# Patient Record
Sex: Female | Born: 2000 | Race: White | Hispanic: No | Marital: Single | Smoking: Never smoker
Health system: Southern US, Community
[De-identification: ages and names within clinical notes are randomized; demographics above are authoritative.]

## PROBLEM LIST (undated history)

## (undated) DIAGNOSIS — F32A Depression, unspecified: Secondary | ICD-10-CM

## (undated) DIAGNOSIS — F329 Major depressive disorder, single episode, unspecified: Secondary | ICD-10-CM

## (undated) DIAGNOSIS — F419 Anxiety disorder, unspecified: Secondary | ICD-10-CM

## (undated) HISTORY — PX: NO PAST SURGERIES: SHX2092

---
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
## 2019-10-29 ENCOUNTER — Emergency Department: Payer: BLUE CROSS/BLUE SHIELD

## 2019-10-29 ENCOUNTER — Other Ambulatory Visit: Payer: Self-pay

## 2019-10-29 ENCOUNTER — Emergency Department
Admission: EM | Admit: 2019-10-29 | Discharge: 2019-10-29 | Disposition: A | Discharge status: Home / Self Care | Payer: BLUE CROSS/BLUE SHIELD | Attending: Emergency Medicine | Admitting: Emergency Medicine

## 2019-10-29 DIAGNOSIS — S22030A Wedge compression fracture of third thoracic vertebra, initial encounter for closed fracture: Secondary | ICD-10-CM | POA: Insufficient documentation

## 2019-10-29 DIAGNOSIS — S22000A Wedge compression fracture of unspecified thoracic vertebra, initial encounter for closed fracture: Secondary | ICD-10-CM

## 2019-10-29 DIAGNOSIS — Y999 Unspecified external cause status: Secondary | ICD-10-CM | POA: Insufficient documentation

## 2019-10-29 DIAGNOSIS — S22009A Unspecified fracture of unspecified thoracic vertebra, initial encounter for closed fracture: Secondary | ICD-10-CM

## 2019-10-29 DIAGNOSIS — Y92049 Unspecified place in boarding-house as the place of occurrence of the external cause: Secondary | ICD-10-CM | POA: Insufficient documentation

## 2019-10-29 DIAGNOSIS — S22088A Other fracture of T11-T12 vertebra, initial encounter for closed fracture: Secondary | ICD-10-CM | POA: Diagnosis not present

## 2019-10-29 DIAGNOSIS — S22050A Wedge compression fracture of T5-T6 vertebra, initial encounter for closed fracture: Secondary | ICD-10-CM | POA: Diagnosis not present

## 2019-10-29 DIAGNOSIS — Y9389 Activity, other specified: Secondary | ICD-10-CM | POA: Diagnosis not present

## 2019-10-29 DIAGNOSIS — M546 Pain in thoracic spine: Secondary | ICD-10-CM

## 2019-10-29 DIAGNOSIS — W108XXA Fall (on) (from) other stairs and steps, initial encounter: Secondary | ICD-10-CM | POA: Insufficient documentation

## 2019-10-29 DIAGNOSIS — S299XXA Unspecified injury of thorax, initial encounter: Secondary | ICD-10-CM | POA: Diagnosis present

## 2019-10-29 DIAGNOSIS — W19XXXA Unspecified fall, initial encounter: Secondary | ICD-10-CM

## 2019-10-29 LAB — POCT PREGNANCY, URINE: Preg Test, Ur: NEGATIVE

## 2019-10-29 MED ORDER — IBUPROFEN 800 MG PO TABS
800.0000 mg | ORAL_TABLET | Freq: Once | ORAL | Status: AC
  Administered 2019-10-29: 06:00:00 800 mg via ORAL
  Filled 2019-10-29: qty 1

## 2019-10-29 MED ORDER — CYCLOBENZAPRINE HCL 5 MG PO TABS: 5.0000 mg | ORAL_TABLET | Freq: Three times a day (TID) | ORAL | 0 refills | Status: AC | PRN

## 2019-10-29 MED ORDER — ACETAMINOPHEN 500 MG PO TABS
1000.0000 mg | ORAL_TABLET | Freq: Once | ORAL | Status: AC
  Administered 2019-10-29: 1000 mg via ORAL
  Filled 2019-10-29: qty 2

## 2019-10-29 NOTE — ED Provider Notes (Signed)
-----------------------------------------   7:11 AM on 10/29/2019 -----------------------------------------  Blood pressure 129/86, pulse 64, temperature 98.3 F (36.8 C), temperature source Oral, resp. rate 16, height 5\' 5"  (1.651 m), weight 59 kg, last menstrual period 10/01/2019, SpO2 100 %.  Assuming care from Dr. 10/03/2019.  In short, Nicole Jackson is a 19 y.o. female with a chief complaint of Fall and Back Pain .  Refer to the original H&P for additional details.  The current plan of care is to follow-up CT results for possible compression fracture.  ----------------------------------------- 8:40 AM on 10/29/2019 -----------------------------------------  CT scan of thoracic spine is significant for T12 transverse process fracture that is nondisplaced, as well as age-indeterminate small compression fractures at T3 and T5.  On reevaluation, patient is neurovascularly intact with minimal pain.  Case was discussed with Dr. 10/31/2019 of neurosurgery, who will be able to see patient in follow-up, but admission not required as patient's pain is under control.  These areas would be difficult for brace placement.  Patient counseled to use Tylenol or ibuprofen, will also prescribe short course of muscle relaxants.  She was counseled to return to the ED for new or worsening symptoms, otherwise schedule follow-up with neurosurgery.  Patient agrees with plan.    Marcell Barlow, MD 10/29/19 [phone]

## 2019-10-29 NOTE — ED Notes (Signed)
Pt transported to xray 

## 2019-10-29 NOTE — ED Notes (Signed)
Pt speaking with this RN in NAD, A&Ox4. Offered pt a blanket and something to drink but reports no needs at this time. Lights turned off for comfort per pt request

## 2019-10-29 NOTE — ED Provider Notes (Signed)
[facility] Emergency Department Provider Note   ____________________________________________   First MD Initiated Contact with Patient 10/29/19 [phone]     (approximate)  I have reviewed the triage vital signs and the nursing notes.   HISTORY  Chief Complaint Fall and Back Pain    HPI Nicole Jackson is a 19 y.o. female who presents to the ED from [facility] with a chief complaint of back pain status post mechanical fall.  Patient reports she fell down 5 steps at a fr[employer] house, landing on her back.  Complains of thoracic back pain.  Took some ibuprofen around midnight which helped her symptoms but pain returns.  Denies striking head or LOC.  Denies neck pain, chest pain, abdominal pain, nausea, vomiting or dizziness.  Initially had some shortness of breath but none currently.  Denies lower back or tailbone pain as noted in the triage note.       Past medical history None  There are no problems to display for this patient.   History reviewed. No pertinent surgical history.  Prior to Admission medications   Not on File    Allergies Patient has no known allergies.  No family history on file.  Social History Social History   Tobacco Use  . Smoking status: Never Smoker  . Smokeless tobacco: Former Engineer, water Use Topics  . Alcohol use: Not on file  . Drug use: Not on file    Review of Systems  Constitutional: No fever/chills Eyes: No visual changes. ENT: No sore throat. Cardiovascular: Denies chest pain. Respiratory: Denies shortness of breath. Gastrointestinal: No abdominal pain.  No nausea, no vomiting.  No diarrhea.  No constipation. Genitourinary: Negative for dysuria. Musculoskeletal: Positive for back pain. Skin: Negative for rash. Neurological: Negative for headaches, focal weakness or numbness.   ____________________________________________   PHYSICAL EXAM:  VITAL SIGNS: ED Triage Vitals  Enc Vitals Group     BP  10/29/19 0332 (!) 142/96     Pulse Rate 10/29/19 0332 74     Resp 10/29/19 0332 16     Temp 10/29/19 0332 98.3 F (36.8 C)     Temp Source 10/29/19 0332 Oral     SpO2 --      Weight 10/29/19 0324 130 lb (59 kg)     Height 10/29/19 0324 5\' 5"  (1.651 m)     Head Circumference --      Peak Flow --      Pain Score 10/29/19 0324 6     Pain Loc --      Pain Edu? --      Excl. in GC? --     Constitutional: Alert and oriented. Well appearing and in no acute distress. Eyes: Conjunctivae are normal. PERRL. EOMI. Head: Atraumatic. Nose: Atraumatic. Mouth/Throat: Mucous membranes are moist.  No dental malocclusion. Neck: No stridor.  No cervical spine tenderness to palpation. Cardiovascular: Normal rate, regular rhythm. Grossly normal heart sounds.  Good peripheral circulation. Respiratory: Normal respiratory effort.  No retractions. Lungs CTAB.  No rib tenderness to palpation. Gastrointestinal: Soft and nontender to light or deep palpation. No distention. No abdominal bruits. No CVA tenderness. Musculoskeletal: No spinal tenderness to palpation.  Paraspinal thoracic tenderness.  Full range of motion of trunk with mild discomfort.  Pelvis is stable.  No lower extremity tenderness nor edema.  No joint effusions. Neurologic:  Normal speech and language. No gross focal neurologic deficits are appreciated. No gait instability. Skin:  Skin is warm, dry and intact.  No rash noted. Psychiatric: Mood and affect are normal. Speech and behavior are normal.  ____________________________________________   LABS (all labs ordered are listed, but only abnormal results are displayed)  Labs Reviewed  POC URINE PREG, ED  POCT PREGNANCY, URINE   ____________________________________________  EKG  None ____________________________________________  RADIOLOGY  ED MD interpretation: Chest x-ray demonstrates no acute cardiopulmonary process; question Jackson superior endplate deformity at T5  Official  radiology report(s): DG Chest 1 View  Result Date: 10/29/2019 CLINICAL DATA:  Fall, upper back pain EXAM: CHEST  1 VIEW COMPARISON:  Concurrent thoracic spine radiographs. FINDINGS: No consolidation, features of edema, pneumothorax, or effusion. Pulmonary vascularity is normally distributed. The cardiomediastinal contours are unremarkable. No acute osseous or soft tissue abnormality. Thoracic spine better visualized on dedicated radiographs. IMPRESSION: No acute cardiopulmonary or traumatic findings in the chest. Electronically Signed   By: Kreg Shropshire M.D.   On: 10/29/2019 06:29   DG Thoracic Spine 2 View  Result Date: 10/29/2019 CLINICAL DATA:  Fall, upper back pain EXAM: THORACIC SPINE 2 VIEWS COMPARISON:  None. FINDINGS: The T1-T4 levels are poorly visualized due to the overlying shoulder soft tissues. Question a Jackson superior endplate deformity at T5. No other acute osseous abnormality. No traumatic listhesis. Incidental note made of a rudimentary rib on the left at L1. Included portions the chest and mediastinum are unremarkable. IMPRESSION: Question Jackson superior endplate deformity at T5. Correlate for point tenderness. The T1-T4 levels are poorly visualized due to the overlying shoulder soft tissues. Electronically Signed   By: Kreg Shropshire M.D.   On: 10/29/2019 06:29    ____________________________________________   PROCEDURES  Procedure(s) performed (including Critical Care):  Procedures   ____________________________________________   INITIAL IMPRESSION / ASSESSMENT AND PLAN / ED COURSE  As part of my medical decision making, I reviewed the following data within the electronic MEDICAL RECORD NUMBER Nursing notes reviewed and incorporated, Old chart reviewed, Radiograph reviewed, Notes from prior ED visits and [place] Controlled Substance Database     Nicole Jackson was evaluated in Emergency Department on 10/29/2019 for the symptoms described in the history of present illness. She was  evaluated in the context of the global COVID-19 pandemic, which necessitated consideration that the patient might be at risk for infection with the SARS-CoV-2 virus that causes COVID-19. Institutional protocols and algorithms that pertain to the evaluation of patients at risk for COVID-19 are in a state of rapid change based on information released by regulatory bodies including the CDC and federal and state organizations. These policies and algorithms were followed during the patient's care in the ED.    19 year old female who presents with thoracic back pain status post mechanical fall.  Will administer Motrin, obtain x-ray imaging study and reassess.   Clinical Course as of Oct 29 710  Thu Oct 29, 2019  0711 Updated patient on x-ray results.  Will obtain CT scan to further delineate questionable endplate deformity at T5.  Care is transferred to Dr. Larinda Buttery at change of shift pending CT scan and disposition.   [JS]    Clinical Course User Index [JS] Irean Hong, MD     ____________________________________________   FINAL CLINICAL IMPRESSION(S) / ED DIAGNOSES  Final diagnoses:  Fall, initial encounter  Acute bilateral thoracic back pain     ED Discharge Orders    None       Note:  This document was prepared using Dragon voice recognition software and may include unintentional dictation errors.   Irean Hong,  MD 10/29/19 [phone]

## 2019-10-29 NOTE — ED Triage Notes (Signed)
Pt arrives to ED via POV from home with c/o lower back pain s/p fall x2hrs PTA. Pt reports slipping on some steps and landing on her tailbone. Pt denies head injury or LOC. Pt is ambulatory without difficulty, in NAD; RR even, regular, and unlabored.

## 2019-10-29 NOTE — ED Notes (Signed)
Pt transported otf for CT 

## 2019-11-18 ENCOUNTER — Other Ambulatory Visit: Payer: Self-pay

## 2019-11-18 ENCOUNTER — Ambulatory Visit
Admission: EM | Admit: 2019-11-18 | Discharge: 2019-11-18 | Disposition: A | Discharge status: Home / Self Care | Payer: BLUE CROSS/BLUE SHIELD | Attending: Emergency Medicine | Admitting: Emergency Medicine

## 2019-11-18 ENCOUNTER — Encounter: Payer: Self-pay | Admitting: Emergency Medicine

## 2019-11-18 DIAGNOSIS — J020 Streptococcal pharyngitis: Secondary | ICD-10-CM

## 2019-11-18 HISTORY — DX: Depression, unspecified: F32.A

## 2019-11-18 HISTORY — DX: Anxiety disorder, unspecified: F41.9

## 2019-11-18 LAB — POCT RAPID STREP A (OFFICE): Rapid Strep A Screen: POSITIVE — AB

## 2019-11-18 MED ORDER — AMOXICILLIN 875 MG PO TABS: 875.0000 mg | ORAL_TABLET | Freq: Two times a day (BID) | ORAL | 0 refills | Status: AC

## 2019-11-18 NOTE — Discharge Instructions (Addendum)
Take the amoxicillin as directed.  Take Tylenol or ibuprofen as needed for discomfort.    Follow up with your primary care provider if your symptoms are not improving.     

## 2019-11-18 NOTE — ED Triage Notes (Signed)
Patient in today c/o sore throat x 4 days. Patient denies fever, but has had body aches and chills. Patient received 1st covid vaccine yesterday. Patient has tried OTC tea, Ibuprofen and salt water gargles.

## 2019-11-18 NOTE — ED Triage Notes (Signed)
Patient tested negative for covid yesterday.

## 2019-11-18 NOTE — ED Provider Notes (Signed)
[name]    CSN: [id number] Arrival date & time: 11/18/19  0940      History   Chief Complaint Chief Complaint  Patient presents with  . Sore Throat    HPI Nicole Jackson is a 19 y.o. female.   Patient presents with sore throat x4 days.  She also reports body aches x1 day but she received her COVID vaccine yesterday and attributes her aches to this.  She denies fever, chills, difficulty swallowing, cough, shortness of breath, vomiting, diarrhea, rash, or other symptoms.  Treatment attempted at home with ibuprofen, tea, and salt water gargles.     The history is provided by the patient.    Past Medical History:  Diagnosis Date  . Anxiety   . Depression     There are no problems to display for this patient.   Past Surgical History:  Procedure Laterality Date  . NO PAST SURGERIES      OB History   No obstetric history on file.      Home Medications    Prior to Admission medications   Medication Sig Start Date End Date Taking? Authorizing Provider  LESSINA-28 0.1-20 MG-MCG tablet Take 1 tablet by mouth daily. 10/12/19  Yes [provider]  sertraline (ZOLOFT) 100 MG tablet Take 100 mg by mouth daily. 11/14/19  Yes [provider]  amoxicillin (AMOXIL) 875 MG tablet Take 1 tablet (875 mg total) by mouth 2 (two) times daily for 7 days. 11/18/19 11/25/19  Mickie Bail, NP  cyclobenzaprine (FLEXERIL) 5 MG tablet Take 1 tablet (5 mg total) by mouth 3 (three) times daily as needed for muscle spasms. 10/29/19   Chesley Noon, MD    Family History Family History  Problem Relation Age of Onset  . Healthy Mother   . Healthy Father     Social History Social History   Tobacco Use  . Smoking status: Never Smoker  . Smokeless tobacco: Former Engineer, water Use Topics  . Alcohol use: Yes    Comment: social  . Drug use: Never     Allergies   Patient has no known allergies.   Review of Systems Review of Systems  Constitutional:  Negative for chills and fever.  HENT: Positive for sore throat. Negative for congestion, ear pain, rhinorrhea and trouble swallowing.   Eyes: Negative for pain and visual disturbance.  Respiratory: Negative for cough and shortness of breath.   Cardiovascular: Negative for chest pain and palpitations.  Gastrointestinal: Negative for abdominal pain, diarrhea, nausea and vomiting.  Genitourinary: Negative for dysuria and hematuria.  Musculoskeletal: Negative for arthralgias and back pain.  Skin: Negative for color change and rash.  Neurological: Negative for seizures and syncope.  All other systems reviewed and are negative.    Physical Exam Triage Vital Signs ED Triage Vitals  Enc Vitals Group     BP      Pulse      Resp      Temp      Temp src      SpO2      Weight      Height      Head Circumference      Peak Flow      Pain Score      Pain Loc      Pain Edu?      Excl. in GC?    No data found.  Updated Vital Signs BP 104/65 (BP Location: Left Arm)   Pulse 89  Temp 98.7 F (37.1 C) (Oral)   Resp 18   Ht 5\' 5"  (1.651 m)   Wt 130 lb (59 kg)   LMP 11/04/2019 (Approximate)   SpO2 98%   BMI 21.63 kg/m   Visual Acuity Right Eye Distance:   Left Eye Distance:   Bilateral Distance:    Right Eye Near:   Left Eye Near:    Bilateral Near:     Physical Exam Vitals and nursing note reviewed.  Constitutional:      General: She is not in acute distress.    Appearance: She is well-developed.  HENT:     Head: Normocephalic and atraumatic.     Right Ear: Tympanic membrane normal.     Left Ear: Tympanic membrane normal.     Nose: Nose normal.     Mouth/Throat:     Mouth: Mucous membranes are moist.     Pharynx: Posterior oropharyngeal erythema present. No oropharyngeal exudate.  Eyes:     Conjunctiva/sclera: Conjunctivae normal.  Cardiovascular:     Rate and Rhythm: Normal rate and regular rhythm.     Heart sounds: No murmur.  Pulmonary:     Effort: Pulmonary  effort is normal. No respiratory distress.     Breath sounds: Normal breath sounds.  Abdominal:     General: Bowel sounds are normal.     Palpations: Abdomen is soft.     Tenderness: There is no abdominal tenderness. There is no guarding.  Musculoskeletal:     Cervical back: Neck supple.  Skin:    General: Skin is warm and dry.     Findings: No rash.  Neurological:     General: No focal deficit present.     Mental Status: She is alert and oriented to person, place, and time.  Psychiatric:        Mood and Affect: Mood normal.        Behavior: Behavior normal.      UC Treatments / Results  Labs (all labs ordered are listed, but only abnormal results are displayed) Labs Reviewed  POCT RAPID STREP A (OFFICE) - Abnormal; Notable for the following components:      Result Value   Rapid Strep A Screen Positive (*)    All other components within normal limits    EKG   Radiology No results found.  Procedures Procedures (including critical care time)  Medications Ordered in UC Medications - No data to display  Initial Impression / Assessment and Plan / UC Course  I have reviewed the triage vital signs and the nursing notes.  Pertinent labs & imaging results that were available during my care of the patient were reviewed by me and considered in my medical decision making (see chart for details).   Strep throat.  Treating with amoxicillin.  Ibuprofen or Tylenol as needed for discomfort.  Instructed patient to follow up with her PCP if her symptoms are not improving.  Patient agrees to plan of care.    Final Clinical Impressions(s) / UC Diagnoses   Final diagnoses:  Streptococcal sore throat     Discharge Instructions     Take the amoxicillin as directed.  Take Tylenol or ibuprofen as needed for discomfort.    Follow up with your primary care provider if your symptoms are not improving.        ED Prescriptions    Medication Sig Dispense Auth. Provider    amoxicillin (AMOXIL) 875 MG tablet Take 1 tablet (875 mg total) by mouth 2 (  two) times daily for 7 days. 14 tablet Sharion Balloon, NP     PDMP not reviewed this encounter.   Sharion Balloon, NP 11/18/19 1010

## 2020-04-28 ENCOUNTER — Ambulatory Visit
Admission: EM | Admit: 2020-04-28 | Discharge: 2020-04-28 | Disposition: A | Discharge status: Home / Self Care | Payer: BLUE CROSS/BLUE SHIELD | Attending: Emergency Medicine | Admitting: Emergency Medicine

## 2020-04-28 DIAGNOSIS — R519 Headache, unspecified: Secondary | ICD-10-CM

## 2020-04-28 MED ORDER — IBUPROFEN 800 MG PO TABS: 800.0000 mg | ORAL_TABLET | Freq: Three times a day (TID) | ORAL | 0 refills | Status: AC | PRN

## 2020-04-28 NOTE — ED Provider Notes (Signed)
Nicole Jackson    CSN: [id number] Arrival date & time: 04/28/20  1623      History   Chief Complaint Chief Complaint  Patient presents with  . Migraine    HPI Nicole Jackson is a 19 y.o. female.   Patient presents with 3-day history of intermittent headache.  She states the headache started in the back of her head and has now moved around to her frontal area.  She has taken Excedrin and Tylenol with moderate relief.  Her headache is currently 4/10.  She reports associated nausea.  She denies dizziness, focal weakness, numbness, facial droop, slurred speech, confusion, chest pain, shortness of breath, abdominal pain, or other symptoms.  Patient states she has a history of migraine headaches when she was younger and used to take Imitrex.  Patient has not seen a neurologist for her headaches.  Patient took Excedrin and Tylenol 2 hours PTA.  The history is provided by the patient.    Past Medical History:  Diagnosis Date  . Anxiety   . Depression     There are no problems to display for this patient.   Past Surgical History:  Procedure Laterality Date  . NO PAST SURGERIES      OB History   No obstetric history on file.      Home Medications    Prior to Admission medications   Medication Sig Start Date End Date Taking? Authorizing Provider  cyclobenzaprine (FLEXERIL) 5 MG tablet Take 1 tablet (5 mg total) by mouth 3 (three) times daily as needed for muscle spasms. 10/29/19   Chesley Noon, MD  ibuprofen (ADVIL) 800 MG tablet Take 1 tablet (800 mg total) by mouth every 8 (eight) hours as needed. 04/28/20   Mickie Bail, NP  LESSINA-28 0.1-20 MG-MCG tablet Take 1 tablet by mouth daily. 10/12/19   [provider]  sertraline (ZOLOFT) 100 MG tablet Take 100 mg by mouth daily. 11/14/19   [provider]    Family History Family History  Problem Relation Age of Onset  . Healthy Mother   . Healthy Father     Social History Social History   Tobacco  Use  . Smoking status: Never Smoker  . Smokeless tobacco: Former Clinical biochemist  . Vaping Use: Never used  Substance Use Topics  . Alcohol use: Yes    Comment: social  . Drug use: Never     Allergies   Patient has no known allergies.   Review of Systems Review of Systems  Constitutional: Negative for chills and fever.  HENT: Negative for ear pain and sore throat.   Eyes: Negative for pain and visual disturbance.  Respiratory: Negative for cough and shortness of breath.   Cardiovascular: Negative for chest pain and palpitations.  Gastrointestinal: Positive for nausea. Negative for abdominal pain, diarrhea and vomiting.  Genitourinary: Negative for dysuria and hematuria.  Musculoskeletal: Negative for arthralgias and back pain.  Skin: Negative for color change and rash.  Neurological: Positive for headaches. Negative for dizziness, tremors, seizures, syncope, facial asymmetry, speech difficulty, weakness, light-headedness and numbness.  All other systems reviewed and are negative.    Physical Exam Triage Vital Signs ED Triage Vitals  Enc Vitals Group     BP      Pulse      Resp      Temp      Temp src      SpO2      Weight  Height      Head Circumference      Peak Flow      Pain Score      Pain Loc      Pain Edu?      Excl. in GC?    No data found.  Updated Vital Signs BP 102/67   Pulse 67   Temp 98.7 F (37.1 C)   Resp 14   LMP 04/21/2020 (Within Days)   SpO2 96%   Visual Acuity Right Eye Distance:   Left Eye Distance:   Bilateral Distance:    Right Eye Near:   Left Eye Near:    Bilateral Near:     Physical Exam Vitals and nursing note reviewed.  Constitutional:      General: She is not in acute distress.    Appearance: She is well-developed. She is not ill-appearing.  HENT:     Head: Normocephalic and atraumatic.     Right Ear: Tympanic membrane normal.     Left Ear: Tympanic membrane normal.     Nose: Nose normal.      Mouth/Throat:     Mouth: Mucous membranes are moist.     Pharynx: Oropharynx is clear.  Eyes:     Extraocular Movements: Extraocular movements intact.     Conjunctiva/sclera: Conjunctivae normal.     Pupils: Pupils are equal, round, and reactive to light.  Cardiovascular:     Rate and Rhythm: Normal rate and regular rhythm.     Heart sounds: No murmur heard.   Pulmonary:     Effort: Pulmonary effort is normal. No respiratory distress.     Breath sounds: Normal breath sounds.  Abdominal:     Palpations: Abdomen is soft.     Tenderness: There is no abdominal tenderness. There is no guarding or rebound.  Musculoskeletal:        General: Normal range of motion.     Cervical back: Neck supple.  Skin:    General: Skin is warm and dry.     Findings: No rash.  Neurological:     General: No focal deficit present.     Mental Status: She is alert and oriented to person, place, and time.     Cranial Nerves: No cranial nerve deficit.     Sensory: No sensory deficit.     Motor: No weakness.     Coordination: Coordination normal.     Gait: Gait normal.  Psychiatric:        Mood and Affect: Mood normal.        Behavior: Behavior normal.      UC Treatments / Results  Labs (all labs ordered are listed, but only abnormal results are displayed) Labs Reviewed  NOVEL CORONAVIRUS, NAA    EKG   Radiology No results found.  Procedures Procedures (including critical care time)  Medications Ordered in UC Medications - No data to display  Initial Impression / Assessment and Plan / UC Course  I have reviewed the triage vital signs and the nursing notes.  Pertinent labs & imaging results that were available during my care of the patient were reviewed by me and considered in my medical decision making (see chart for details).   Acute non-intractable headache.  Patient is well-appearing and her exam is reassuring.  Treating with ibuprofen.  Instructed patient to follow-up with her PCP  if her symptoms are not improving.  Patient agrees to plan of care.   Final Clinical Impressions(s) / UC Diagnoses   Final  diagnoses:  Acute nonintractable headache, unspecified headache type     Discharge Instructions     Take the ibuprofen as directed.    Follow up with your primary care provider if your symptoms are not improving.       ED Prescriptions    Medication Sig Dispense Auth. Provider   ibuprofen (ADVIL) 800 MG tablet Take 1 tablet (800 mg total) by mouth every 8 (eight) hours as needed. 21 tablet Mickie Bail, NP     I have reviewed the PDMP during this encounter.   Mickie Bail, NP 04/28/20 [phone]

## 2020-04-28 NOTE — ED Triage Notes (Signed)
Patient c/o migraine x3 days. Reports she used to get migraines when she was little and used to take imatrex. Denies head trauma. Reports she had a negative rapid COVID test yesterday.   Patient is vaccinated; patient agreeable to PCR COVID testing.

## 2020-04-28 NOTE — Discharge Instructions (Signed)
Take the ibuprofen as directed.  Follow up with your primary care provider if your symptoms are not improving.     

## 2020-04-30 LAB — NOVEL CORONAVIRUS, NAA: SARS-CoV-2, NAA: NOT DETECTED

## 2020-04-30 LAB — SARS-COV-2, NAA 2 DAY TAT

## 2020-05-11 ENCOUNTER — Ambulatory Visit
Admission: EM | Admit: 2020-05-11 | Discharge: 2020-05-11 | Disposition: A | Discharge status: Home / Self Care | Payer: BLUE CROSS/BLUE SHIELD | Attending: Emergency Medicine | Admitting: Emergency Medicine

## 2020-05-11 DIAGNOSIS — J069 Acute upper respiratory infection, unspecified: Secondary | ICD-10-CM | POA: Insufficient documentation

## 2020-05-11 DIAGNOSIS — J029 Acute pharyngitis, unspecified: Secondary | ICD-10-CM | POA: Diagnosis not present

## 2020-05-11 LAB — POCT RAPID STREP A (OFFICE): Rapid Strep A Screen: NEGATIVE

## 2020-05-11 LAB — POCT MONO SCREEN (KUC): Mono, POC: NEGATIVE

## 2020-05-11 NOTE — Discharge Instructions (Addendum)
Your rapid strep test is negative.  A throat culture is pending; we will call you if it is positive requiring treatment.    Your mono test is negative.    Take Tylenol or ibuprofen as needed for fever or discomfort.   Follow up with your primary care provider if your symptoms are not improving.

## 2020-05-11 NOTE — ED Triage Notes (Signed)
Patient c/o generalized body aches, sore throat that is worse when swallowing, and swollen lymph nodes in the neck. Patient is COVID vaccinated; reports a negative covid test last week.

## 2020-05-11 NOTE — ED Provider Notes (Signed)
[name]    CSN: [id number] Arrival date & time: 05/11/20  1615      History   Chief Complaint Chief Complaint  Patient presents with  . Generalized Body Aches  . Sore Throat  . Lymphadenopathy    HPI Nicole Jackson is a 19 y.o. female.   Patient presents with 2 day history of body aches, sore throat, and swollen lymph nodes in her neck.  Patient denies fever chills, rash, cough, shortness of breath, vomiting, diarrhea, or other symptoms.  Treatment attempted at home with Sudafed.  Patient was seen here on 04/28/2020; diagnosed with acute non-intractable headache; treated with ibuprofen.  The history is provided by the patient.    Past Medical History:  Diagnosis Date  . Anxiety   . Depression     There are no problems to display for this patient.   Past Surgical History:  Procedure Laterality Date  . NO PAST SURGERIES      OB History   No obstetric history on file.      Home Medications    Prior to Admission medications   Medication Sig Start Date End Date Taking? Authorizing Provider  adapalene (DIFFERIN) 0.1 % cream Apply topically every other day. 10/15/19  Yes [provider]  LESSINA-28 0.1-20 MG-MCG tablet Take 1 tablet by mouth daily. 10/12/19  Yes [provider]  sertraline (ZOLOFT) 100 MG tablet Take 100 mg by mouth daily. 11/14/19  Yes [provider]  cyclobenzaprine (FLEXERIL) 5 MG tablet Take 1 tablet (5 mg total) by mouth 3 (three) times daily as needed for muscle spasms. 10/29/19   Chesley Noon, MD  ibuprofen (ADVIL) 800 MG tablet Take 1 tablet (800 mg total) by mouth every 8 (eight) hours as needed. 04/28/20   Mickie Bail, NP    Family History Family History  Problem Relation Age of Onset  . Healthy Mother   . Healthy Father     Social History Social History   Tobacco Use  . Smoking status: Never Smoker  . Smokeless tobacco: Former Clinical biochemist  . Vaping Use: Never used  Substance Use Topics    . Alcohol use: Yes    Comment: social  . Drug use: Never     Allergies   Patient has no known allergies.   Review of Systems Review of Systems  Constitutional: Negative for chills and fever.  HENT: Positive for sore throat. Negative for ear pain.   Eyes: Negative for pain and visual disturbance.  Respiratory: Negative for cough and shortness of breath.   Cardiovascular: Negative for chest pain and palpitations.  Gastrointestinal: Negative for abdominal pain and vomiting.  Genitourinary: Negative for dysuria and hematuria.  Musculoskeletal: Negative for arthralgias and back pain.  Skin: Negative for color change and rash.  Neurological: Negative for seizures and syncope.  All other systems reviewed and are negative.    Physical Exam Triage Vital Signs ED Triage Vitals  Enc Vitals Group     BP 05/11/20 1637 107/65     Pulse Rate 05/11/20 1637 80     Resp 05/11/20 1637 16     Temp 05/11/20 1637 99 F (37.2 C)     Temp src --      SpO2 05/11/20 1637 98 %     Weight --      Height --      Head Circumference --      Peak Flow --      Pain Score 05/11/20  1634 4     Pain Loc --      Pain Edu? --      Excl. in GC? --    No data found.  Updated Vital Signs BP 107/65   Pulse 80   Temp 99 F (37.2 C)   Resp 16   LMP 04/21/2020 (Within Days)   SpO2 98%   Visual Acuity Right Eye Distance:   Left Eye Distance:   Bilateral Distance:    Right Eye Near:   Left Eye Near:    Bilateral Near:     Physical Exam Vitals and nursing note reviewed.  Constitutional:      General: She is not in acute distress.    Appearance: She is well-developed. She is not ill-appearing.  HENT:     Head: Normocephalic and atraumatic.     Right Ear: Tympanic membrane normal.     Left Ear: Tympanic membrane normal.     Nose: Nose normal.     Mouth/Throat:     Mouth: Mucous membranes are moist.     Pharynx: Posterior oropharyngeal erythema present. No oropharyngeal exudate.  Eyes:      Conjunctiva/sclera: Conjunctivae normal.  Cardiovascular:     Rate and Rhythm: Normal rate and regular rhythm.     Heart sounds: No murmur heard.   Pulmonary:     Effort: Pulmonary effort is normal. No respiratory distress.     Breath sounds: Normal breath sounds.  Abdominal:     Palpations: Abdomen is soft.     Tenderness: There is no abdominal tenderness. There is no guarding or rebound.  Musculoskeletal:     Cervical back: Neck supple.  Skin:    General: Skin is warm and dry.     Findings: No rash.  Neurological:     General: No focal deficit present.     Mental Status: She is alert and oriented to person, place, and time.     Gait: Gait normal.  Psychiatric:        Mood and Affect: Mood normal.        Behavior: Behavior normal.      UC Treatments / Results  Labs (all labs ordered are listed, but only abnormal results are displayed) Labs Reviewed  CULTURE, GROUP A STREP [facility])  POCT RAPID STREP A (OFFICE)  POCT MONO SCREEN ([facility])    EKG   Radiology No results found.  Procedures Procedures (including critical care time)  Medications Ordered in UC Medications - No data to display  Initial Impression / Assessment and Plan / UC Course  I have reviewed the triage vital signs and the nursing notes.  Pertinent labs & imaging results that were available during my care of the patient were reviewed by me and considered in my medical decision making (see chart for details).   Sore throat, viral URI.  Rapid strep negative; culture pending.  Mono negative.  Instructed patient to take Tylenol or ibuprofen as needed for fever or discomfort and to follow-up with her PCP if her symptoms are not improving.  Patient agrees to plan of care.  Final Clinical Impressions(s) / UC Diagnoses   Final diagnoses:  Viral URI  Sore throat     Discharge Instructions     Your rapid strep test is negative.  A throat culture is pending; we will call you if it is positive  requiring treatment.    Your mono test is negative.    Take Tylenol or ibuprofen as needed for fever or  discomfort.   Follow up with your primary care provider if your symptoms are not improving.       ED Prescriptions    None     PDMP not reviewed this encounter.   Mickie Bail, NP 05/11/20 [phone]

## 2020-05-14 LAB — CULTURE, GROUP A STREP (THRC)

## 2020-06-27 ENCOUNTER — Ambulatory Visit
Admission: EM | Admit: 2020-06-27 | Discharge: 2020-06-27 | Disposition: A | Discharge status: Home / Self Care | Payer: BLUE CROSS/BLUE SHIELD | Attending: Emergency Medicine | Admitting: Emergency Medicine

## 2020-06-27 ENCOUNTER — Emergency Department: Admission: EM | Admit: 2020-06-27 | Discharge: 2020-06-27 | Discharge status: Absent without leave | Payer: Self-pay

## 2020-06-27 ENCOUNTER — Other Ambulatory Visit: Payer: Self-pay

## 2020-06-27 DIAGNOSIS — J0100 Acute maxillary sinusitis, unspecified: Secondary | ICD-10-CM | POA: Diagnosis present

## 2020-06-27 DIAGNOSIS — J069 Acute upper respiratory infection, unspecified: Secondary | ICD-10-CM | POA: Diagnosis not present

## 2020-06-27 LAB — POCT RAPID STREP A (OFFICE): Rapid Strep A Screen: NEGATIVE

## 2020-06-27 MED ORDER — AMOXICILLIN 875 MG PO TABS: 875.0000 mg | ORAL_TABLET | Freq: Two times a day (BID) | ORAL | 0 refills | Status: AC

## 2020-06-27 NOTE — ED Triage Notes (Signed)
Pt reports having cough, sore throat and post nasal drip x1 week. Painful whem swallowing.   No known covid exposure.

## 2020-06-27 NOTE — Discharge Instructions (Signed)
Take the antibiotic as directed.    Follow up with your primary care provider if your symptoms are not improving.    Your rapid strep test is negative.  A throat culture is pending; we will call you if it is positive requiring treatment.    Your COVID test is pending.  You should self quarantine until the test result is back.

## 2020-06-27 NOTE — ED Provider Notes (Signed)
Nicole Jackson    CSN: [id number] Arrival date & time: 06/27/20  1914      History   Chief Complaint Chief Complaint  Patient presents with  . Cough    HPI Nicole Jackson is a 19 y.o. female.   Patient presents with 1 week history of postnasal drip, nasal congestion, cough productive of yellow phlegm, sore throat.  She denies fever, chills, rash, shortness of breath, vomiting, diarrhea, or other symptoms.  OTC treatment attempted.  Her medical history includes anxiety and depression.  The history is provided by the patient and medical records.    Past Medical History:  Diagnosis Date  . Anxiety   . Depression     There are no problems to display for this patient.   Past Surgical History:  Procedure Laterality Date  . NO PAST SURGERIES      OB History   No obstetric history on file.      Home Medications    Prior to Admission medications   Medication Sig Start Date End Date Taking? Authorizing Provider  adapalene (DIFFERIN) 0.1 % cream Apply topically every other day. 10/15/19   [provider]  amoxicillin (AMOXIL) 875 MG tablet Take 1 tablet (875 mg total) by mouth 2 (two) times daily for 7 days. 06/27/20 07/04/20  Mickie Bail, NP  cyclobenzaprine (FLEXERIL) 5 MG tablet Take 1 tablet (5 mg total) by mouth 3 (three) times daily as needed for muscle spasms. 10/29/19   Chesley Noon, MD  ibuprofen (ADVIL) 800 MG tablet Take 1 tablet (800 mg total) by mouth every 8 (eight) hours as needed. 04/28/20   Mickie Bail, NP  LESSINA-28 0.1-20 MG-MCG tablet Take 1 tablet by mouth daily. 10/12/19   [provider]  sertraline (ZOLOFT) 100 MG tablet Take 100 mg by mouth daily. 11/14/19   [provider]    Family History Family History  Problem Relation Age of Onset  . Healthy Mother   . Healthy Father     Social History Social History   Tobacco Use  . Smoking status: Never Smoker  . Smokeless tobacco: Former Clinical biochemist  .  Vaping Use: Never used  Substance Use Topics  . Alcohol use: Yes    Comment: social  . Drug use: Yes    Types: Marijuana     Allergies   Patient has no known allergies.   Review of Systems Review of Systems  Constitutional: Negative for chills and fever.  HENT: Positive for congestion, postnasal drip, rhinorrhea and sinus pressure. Negative for ear pain and sore throat.   Eyes: Negative for pain and visual disturbance.  Respiratory: Negative for cough and shortness of breath.   Cardiovascular: Negative for chest pain and palpitations.  Gastrointestinal: Negative for abdominal pain and vomiting.  Genitourinary: Negative for dysuria and hematuria.  Musculoskeletal: Negative for arthralgias and back pain.  Skin: Negative for color change and rash.  Neurological: Negative for seizures and syncope.  All other systems reviewed and are negative.    Physical Exam Triage Vital Signs ED Triage Vitals  Enc Vitals Group     BP      Pulse      Resp      Temp      Temp src      SpO2      Weight      Height      Head Circumference      Peak Flow  Pain Score      Pain Loc      Pain Edu?      Excl. in GC?    No data found.  Updated Vital Signs BP 103/64   Pulse 62   Temp 98.2 F (36.8 C) (Oral)   Resp 16   Ht 5\' 5"  (1.651 m)   Wt 135 lb (61.2 kg)   LMP 06/20/2020   SpO2 97%   BMI 22.47 kg/m   Visual Acuity Right Eye Distance:   Left Eye Distance:   Bilateral Distance:    Right Eye Near:   Left Eye Near:    Bilateral Near:     Physical Exam Vitals and nursing note reviewed.  Constitutional:      General: She is not in acute distress.    Appearance: She is well-developed.  HENT:     Head: Normocephalic and atraumatic.     Right Ear: Tympanic membrane normal.     Left Ear: Tympanic membrane normal.     Nose: Congestion and rhinorrhea present.     Mouth/Throat:     Mouth: Mucous membranes are moist.     Pharynx: Oropharynx is clear.  Eyes:      Conjunctiva/sclera: Conjunctivae normal.  Cardiovascular:     Rate and Rhythm: Normal rate and regular rhythm.     Heart sounds: Normal heart sounds.  Pulmonary:     Effort: Pulmonary effort is normal. No respiratory distress.     Breath sounds: Normal breath sounds. No wheezing or rhonchi.  Abdominal:     Palpations: Abdomen is soft.     Tenderness: There is no abdominal tenderness. There is no guarding or rebound.  Musculoskeletal:     Cervical back: Neck supple.  Skin:    General: Skin is warm and dry.     Findings: No rash.  Neurological:     General: No focal deficit present.     Mental Status: She is alert and oriented to person, place, and time.     Gait: Gait normal.  Psychiatric:        Mood and Affect: Mood normal.        Behavior: Behavior normal.      UC Treatments / Results  Labs (all labs ordered are listed, but only abnormal results are displayed) Labs Reviewed  NOVEL CORONAVIRUS, NAA  CULTURE, GROUP A STREP [facility])  POCT RAPID STREP A (OFFICE)    EKG   Radiology No results found.  Procedures Procedures (including critical care time)  Medications Ordered in UC Medications - No data to display  Initial Impression / Assessment and Plan / UC Course  I have reviewed the triage vital signs and the nursing notes.  Pertinent labs & imaging results that were available during my care of the patient were reviewed by me and considered in my medical decision making (see chart for details).   Acute sinusitis.  Rapid strep negative; culture pending.  PCR COVID pending.  Treating with amoxicillin, ibuprofen, Mucinex.  Instructed patient to follow-up with her PCP if her symptoms are not improving.  Patient agrees to plan of care.    Final Clinical Impressions(s) / UC Diagnoses   Final diagnoses:  Acute non-recurrent maxillary sinusitis     Discharge Instructions     Take the antibiotic as directed.    Follow up with your primary care provider if your  symptoms are not improving.    Your rapid strep test is negative.  A throat culture is pending;  we will call you if it is positive requiring treatment.    Your COVID test is pending.  You should self quarantine until the test result is back.            ED Prescriptions    Medication Sig Dispense Auth. Provider   amoxicillin (AMOXIL) 875 MG tablet Take 1 tablet (875 mg total) by mouth 2 (two) times daily for 7 days. 14 tablet Mickie Bail, NP     PDMP not reviewed this encounter.   Mickie Bail, NP 06/27/20 [phone]

## 2020-06-28 LAB — NOVEL CORONAVIRUS, NAA: SARS-CoV-2, NAA: NOT DETECTED

## 2020-06-28 LAB — SARS-COV-2, NAA 2 DAY TAT

## 2020-06-29 LAB — CULTURE, GROUP A STREP (THRC)

## 2020-11-07 ENCOUNTER — Ambulatory Visit: Admit: 2020-11-07 | Discharge status: Against Medical Advice | Payer: BLUE CROSS/BLUE SHIELD

## 2020-11-18 ENCOUNTER — Other Ambulatory Visit: Payer: Self-pay

## 2020-11-18 ENCOUNTER — Emergency Department: Payer: BLUE CROSS/BLUE SHIELD

## 2020-11-18 ENCOUNTER — Emergency Department
Admission: EM | Admit: 2020-11-18 | Discharge: 2020-11-18 | Disposition: A | Discharge status: Home / Self Care | Payer: BLUE CROSS/BLUE SHIELD | Attending: Emergency Medicine | Admitting: Emergency Medicine

## 2020-11-18 DIAGNOSIS — Z87891 Personal history of nicotine dependence: Secondary | ICD-10-CM | POA: Insufficient documentation

## 2020-11-18 DIAGNOSIS — R109 Unspecified abdominal pain: Secondary | ICD-10-CM | POA: Insufficient documentation

## 2020-11-18 LAB — URINALYSIS, COMPLETE (UACMP) WITH MICROSCOPIC
Bilirubin Urine: NEGATIVE
Glucose, UA: NEGATIVE mg/dL
Hgb urine dipstick: NEGATIVE
Ketones, ur: 20 mg/dL — AB
Nitrite: NEGATIVE
Protein, ur: 100 mg/dL — AB
Specific Gravity, Urine: 1.028 (ref 1.005–1.030)
pH: 6 (ref 5.0–8.0)

## 2020-11-18 LAB — COMPREHENSIVE METABOLIC PANEL
ALT: 24 U/L (ref 0–44)
AST: 37 U/L (ref 15–41)
Albumin: 5 g/dL (ref 3.5–5.0)
Alkaline Phosphatase: 52 U/L (ref 38–126)
Anion gap: 11 (ref 5–15)
BUN: 12 mg/dL (ref 6–20)
CO2: 23 mmol/L (ref 22–32)
Calcium: 9.8 mg/dL (ref 8.9–10.3)
Chloride: 103 mmol/L (ref 98–111)
Creatinine, Ser: 0.9 mg/dL (ref 0.44–1.00)
GFR, Estimated: >60 mL/min (ref >60)
Glucose, Bld: 92 mg/dL (ref 70–99)
Potassium: 3.6 mmol/L (ref 3.5–5.1)
Sodium: 137 mmol/L (ref 135–145)
Total Bilirubin: 0.7 mg/dL (ref 0.3–1.2)
Total Protein: 8.5 g/dL — ABNORMAL HIGH (ref 6.5–8.1)

## 2020-11-18 LAB — CBC
HCT: 43.5 % (ref 36.0–46.0)
Hemoglobin: 14.5 g/dL (ref 12.0–15.0)
MCH: 27 pg (ref 26.0–34.0)
MCHC: 33.3 g/dL (ref 30.0–36.0)
MCV: 81 fL (ref 80.0–100.0)
Platelets: 293 10*3/uL (ref 150–400)
RBC: 5.37 MIL/uL — ABNORMAL HIGH (ref 3.87–5.11)
RDW: 13.5 % (ref 11.5–15.5)
WBC: 10.9 10*3/uL — ABNORMAL HIGH (ref 4.0–10.5)
nRBC: 0 % (ref 0.0–0.2)

## 2020-11-18 LAB — POC URINE PREG, ED: Preg Test, Ur: NEGATIVE

## 2020-11-18 LAB — LIPASE, BLOOD: Lipase: 38 U/L (ref 11–51)

## 2020-11-18 MED ORDER — DICYCLOMINE HCL 10 MG PO CAPS: 10.0000 mg | ORAL_CAPSULE | Freq: Three times a day (TID) | ORAL | 0 refills | Status: AC

## 2020-11-18 NOTE — ED Triage Notes (Signed)
Pt having R sided abdominal pain and diarrhea for "a while"- pt went to urgent care for same on 3/28 and was tested for STIs and UTI but all came back clear- pt was placed on cephalexin and states it made her feel worse

## 2020-11-18 NOTE — ED Provider Notes (Signed)
[facility] Emergency Department Provider Note  Time seen: 2:56 PM  I have reviewed the triage vital signs and the nursing notes.   HISTORY  Chief Complaint Abdominal Pain  HPI Nicole Jackson is a 20 y.o. female with a past medical history anxiety, depression presents to the emergency department for left flank pain.  According to the patient with the past 1 month she has been experiencing intermittent left flank pain.  Denies any dysuria or hematuria, vaginal bleeding or discharge.  Patient states she was seen by her student health doctor last week and had a urine sample performed as well as pelvic exam checking for STIs which were negative.  Patient continues state intermittent sharp flank pains currently says is mild 2 or 3/10 per patient.  Past Medical History:  Diagnosis Date  . Anxiety   . Depression     There are no problems to display for this patient.   Past Surgical History:  Procedure Laterality Date  . NO PAST SURGERIES      Prior to Admission medications   Medication Sig Start Date End Date Taking? Authorizing Provider  adapalene (DIFFERIN) 0.1 % cream Apply topically every other day. 10/15/19   [provider]  cyclobenzaprine (FLEXERIL) 5 MG tablet Take 1 tablet (5 mg total) by mouth 3 (three) times daily as needed for muscle spasms. 10/29/19   Chesley Noon, MD  ibuprofen (ADVIL) 800 MG tablet Take 1 tablet (800 mg total) by mouth every 8 (eight) hours as needed. 04/28/20   Mickie Bail, NP  LESSINA-28 0.1-20 MG-MCG tablet Take 1 tablet by mouth daily. 10/12/19   [provider]  sertraline (ZOLOFT) 100 MG tablet Take 100 mg by mouth daily. 11/14/19   [provider]    No Known Allergies  Family History  Problem Relation Age of Onset  . Healthy Mother   . Healthy Father     Social History Social History   Tobacco Use  . Smoking status: Never Smoker  . Smokeless tobacco: Former Clinical biochemist  . Vaping  Use: Never used  Substance Use Topics  . Alcohol use: Yes    Comment: social  . Drug use: Yes    Types: Marijuana    Review of Systems Constitutional: Negative for fever. Cardiovascular: Negative for chest pain. Respiratory: Negative for shortness of breath. Gastrointestinal: 1 month of intermittent left flank pain. Genitourinary: Negative for urinary compaints.  Negative for vaginal bleeding or discharge. Musculoskeletal: Negative for musculoskeletal complaints Neurological: Negative for headache All other ROS negative  ____________________________________________   PHYSICAL EXAM:  VITAL SIGNS: ED Triage Vitals [11/18/20 1419]  Enc Vitals Group     BP (!) 141/88     Pulse Rate 85     Resp 16     Temp 98.6 F (37 C)     Temp Source Oral     SpO2 97 %     Weight 135 lb (61.2 kg)     Height 5\' 5"  (1.651 m)     Head Circumference      Peak Flow      Pain Score 3     Pain Loc      Pain Edu?      Excl. in GC?    Constitutional: Alert and oriented. Well appearing and in no distress. Eyes: Normal exam ENT      Head: Normocephalic and atraumatic.      Mouth/Throat: Mucous membranes are moist. Cardiovascular: Normal rate, regular rhythm.  Respiratory: Normal respiratory effort without tachypnea nor retractions. Breath sounds are clear Gastrointestinal: Soft and nontender. No distention.  No CVA tenderness.  Benign abdominal exam. Musculoskeletal: Nontender with normal range of motion in all extremities.  Neurologic:  Normal speech and language. No gross focal neurologic deficits  Skin:  Skin is warm, dry and intact.  Psychiatric: Mood and affect are normal.   RADIOLOGY  CT negative  ____________________________________________   INITIAL IMPRESSION / ASSESSMENT AND PLAN / ED COURSE  Pertinent labs & imaging results that were available during my care of the patient were reviewed by me and considered in my medical decision making (see chart for details).    Patient presents to the emergency department for 1 month of intermittent but sharp left flank pain currently says mild 2 or 3/10.  Patient states no vaginal bleeding or discharge.  No dysuria or hematuria.  No nausea vomiting or diarrhea.  Patient has been seen by her student health doctor for the same had a urine test as well as STI test that were negative.  Given the patient's intermittent symptoms severe at times mild at other times differential would include ureterolithiasis, UTI or pyelonephritis, colitis or diverticulitis, IBS or IBD.  No family history of IBD.  We will check labs, urinalysis and a CT renal scan to further evaluate.  Patient agreeable to plan of care.  CT is essentially negative.  Urinalysis shows some white cells but given no dysuria or hematuria we will send urine culture.  Patient will be discharged with PCP follow-up and a prescription for Bentyl.  Nicole Jackson was evaluated in Emergency Department on 11/18/2020 for the symptoms described in the history of present illness. She was evaluated in the context of the global COVID-19 pandemic, which necessitated consideration that the patient might be at risk for infection with the SARS-CoV-2 virus that causes COVID-19. Institutional protocols and algorithms that pertain to the evaluation of patients at risk for COVID-19 are in a state of rapid change based on information released by regulatory bodies including the CDC and federal and state organizations. These policies and algorithms were followed during the patient's care in the ED.  ____________________________________________   FINAL CLINICAL IMPRESSION(S) / ED DIAGNOSES  Left flank pain   Minna Antis, MD 11/18/20 [phone]

## 2020-11-21 LAB — URINE CULTURE: Culture: >=100000 — AB

## 2020-11-22 NOTE — Progress Notes (Signed)
ED Antimicrobial Stewardship Positive Culture Follow Up   Nicole Jackson is an 20 y.o. female who presented to [facility] on 11/18/2020 with a chief complaint of  Chief Complaint  Patient presents with  . Abdominal Pain    Recent Results (from the past 720 hour(s))  Urine Culture     Status: Abnormal   Collection Time: 11/18/20  2:40 PM   Specimen: Urine, Random  Result Value Ref Range Status   Specimen Description   Final    URINE, RANDOM Performed at [facility], [address], [place], [place] [address]    Special Requests   Final    NONE Performed at [facility], [address] [place], [place] [address]    Culture >=100,000 COLONIES/mL ENTEROCOCCUS FAECALIS (A)  Final   Report Status 11/21/2020 FINAL  Final   Organism ID, Bacteria ENTEROCOCCUS FAECALIS (A)  Final      Susceptibility   Enterococcus faecalis - MIC*    AMPICILLIN <=2 SENSITIVE Sensitive     NITROFURANTOIN <=16 SENSITIVE Sensitive     VANCOMYCIN 2 SENSITIVE Sensitive     * >=100,000 COLONIES/mL ENTEROCOCCUS FAECALIS    [x]  Patient discharged originally without antimicrobial agent and treatment is now indicated   New antibiotic prescription: Macrobid 100mg  BID x5d (no refills).  Contacted: -- Spoke with Pt (Cell: [phone]) discussed results, counseled on antibiotic, and confirmed preferred pharmacy is [facility] on . -- [phone]) and left Rx (as above)  ED Provider: Dr. [employer] D Paradise Vensel 11/22/2020, 12:29 PM Clinical Pharmacist

## 2020-12-01 ENCOUNTER — Ambulatory Visit: Admit: 2020-12-01 | Discharge status: Against Medical Advice | Payer: BLUE CROSS/BLUE SHIELD

## 2020-12-01 ENCOUNTER — Emergency Department
Admission: EM | Admit: 2020-12-01 | Discharge: 2020-12-01 | Disposition: A | Discharge status: Home / Self Care | Payer: BLUE CROSS/BLUE SHIELD | Attending: Emergency Medicine | Admitting: Emergency Medicine

## 2020-12-01 ENCOUNTER — Other Ambulatory Visit: Payer: Self-pay

## 2020-12-01 DIAGNOSIS — R300 Dysuria: Secondary | ICD-10-CM | POA: Insufficient documentation

## 2020-12-01 DIAGNOSIS — Z87891 Personal history of nicotine dependence: Secondary | ICD-10-CM | POA: Diagnosis not present

## 2020-12-01 DIAGNOSIS — R109 Unspecified abdominal pain: Secondary | ICD-10-CM

## 2020-12-01 LAB — LIPASE, BLOOD: Lipase: 28 U/L (ref 11–51)

## 2020-12-01 LAB — CBC
HCT: 40.9 % (ref 36.0–46.0)
Hemoglobin: 13.2 g/dL (ref 12.0–15.0)
MCH: 27.2 pg (ref 26.0–34.0)
MCHC: 32.3 g/dL (ref 30.0–36.0)
MCV: 84.2 fL (ref 80.0–100.0)
Platelets: 241 10*3/uL (ref 150–400)
RBC: 4.86 MIL/uL (ref 3.87–5.11)
RDW: 13.7 % (ref 11.5–15.5)
WBC: 8 10*3/uL (ref 4.0–10.5)
nRBC: 0 % (ref 0.0–0.2)

## 2020-12-01 LAB — COMPREHENSIVE METABOLIC PANEL
ALT: 18 U/L (ref 0–44)
AST: 28 U/L (ref 15–41)
Albumin: 4.4 g/dL (ref 3.5–5.0)
Alkaline Phosphatase: 49 U/L (ref 38–126)
Anion gap: 8 (ref 5–15)
BUN: 12 mg/dL (ref 6–20)
CO2: 28 mmol/L (ref 22–32)
Calcium: 9.7 mg/dL (ref 8.9–10.3)
Chloride: 102 mmol/L (ref 98–111)
Creatinine, Ser: 0.87 mg/dL (ref 0.44–1.00)
GFR, Estimated: >60 mL/min (ref >60)
Glucose, Bld: 101 mg/dL — ABNORMAL HIGH (ref 70–99)
Potassium: 4.8 mmol/L (ref 3.5–5.1)
Sodium: 138 mmol/L (ref 135–145)
Total Bilirubin: 0.9 mg/dL (ref 0.3–1.2)
Total Protein: 7.8 g/dL (ref 6.5–8.1)

## 2020-12-01 LAB — URINALYSIS, COMPLETE (UACMP) WITH MICROSCOPIC
Bilirubin Urine: NEGATIVE
Glucose, UA: NEGATIVE mg/dL
Hgb urine dipstick: NEGATIVE
Ketones, ur: NEGATIVE mg/dL
Nitrite: NEGATIVE
Protein, ur: NEGATIVE mg/dL
Specific Gravity, Urine: 1.005 (ref 1.005–1.030)
pH: 9 — ABNORMAL HIGH (ref 5.0–8.0)

## 2020-12-01 LAB — POC URINE PREG, ED: Preg Test, Ur: NEGATIVE

## 2020-12-01 MED ORDER — FOSFOMYCIN TROMETHAMINE 3 G PO PACK
3.0000 g | PACK | Freq: Once | ORAL | Status: AC
  Administered 2020-12-01: 3 g via ORAL
  Filled 2020-12-01: qty 3

## 2020-12-01 NOTE — ED Triage Notes (Signed)
Pt to ED for left flank pain x1 week. Denies urinary sx. Denies vomiting. +nausea. Diarrhea x2 in past 24 hours.   Recently treated for IBS

## 2020-12-01 NOTE — ED Notes (Signed)
See triage note. Pt states was seen at [facility] 2 wks ago for same. Pt c/o L flank pain. Finished abx 2d ago for UTI and was treated for IBS, both tx initiated 2wk ago. Pt states that flank pain has continued, and now pt has diarrhea alternating with constipation and frank red blood stool with mucous, since 3wk ago. States she can also see "undigested food" in her stool intermittently.  Pt has been trying to limit gluten but states still having GI symptoms. Also has sharp lower L abdominal pain.  Pt states that sometimes she notices "a lump" in L abdomen when her "stomach is inflammed".  Pt in NAD. States pain is 6/10 at this time.

## 2020-12-01 NOTE — ED Notes (Signed)
Pt agreeable with plan for discharge as discussed by provider- this nurse has verbally reinforced d/c instructions and provided pt with written copy - pt acknowledges verbal understanding and denies any additional questions, concerns, needs.  Pt ambulatory independently with steady gait at discharge; no acute distress.

## 2020-12-01 NOTE — ED Notes (Signed)
EDP at bedside  

## 2020-12-01 NOTE — ED Provider Notes (Signed)
[facility] Emergency Department Provider Note  Time seen: 5:05 PM  I have reviewed the triage vital signs and the nursing notes.   HISTORY  Chief Complaint Flank Pain   HPI Nicole Jackson is a 20 y.o. female with a past medical history anxiety, depression, presents emergency department for continued left flank pain.  According to the patient and record review patient was seen in the emergency department 11/18/20 for similar pain at that time had a reassuring work-up including a negative CT scan.  Patient was referred to GI medicine but states her primary care doctor in [place] wanted to see her first before she saw the GI doctor.  She has an appointment in 2 weeks when she returns back to [place] for the summer.  Patient states the pain is intermittent.  Does state mild dysuria.  Patient did have a positive urine culture after visit earlier in April but completed a course of antibiotics.  Patient is convinced she has celiac's disease and is hoping to get blood work to test for that.  Past Medical History:  Diagnosis Date  . Anxiety   . Depression     There are no problems to display for this patient.   Past Surgical History:  Procedure Laterality Date  . NO PAST SURGERIES      Prior to Admission medications   Medication Sig Start Date End Date Taking? Authorizing Provider  adapalene (DIFFERIN) 0.1 % cream Apply topically every other day. 10/15/19   [provider]  cyclobenzaprine (FLEXERIL) 5 MG tablet Take 1 tablet (5 mg total) by mouth 3 (three) times daily as needed for muscle spasms. 10/29/19   Chesley Noon, MD  dicyclomine (BENTYL) 10 MG capsule Take 1 capsule (10 mg total) by mouth 3 (three) times daily before meals for 10 days. 11/18/20 11/28/20  Minna Antis, MD  ibuprofen (ADVIL) 800 MG tablet Take 1 tablet (800 mg total) by mouth every 8 (eight) hours as needed. 04/28/20   Mickie Bail, NP  LESSINA-28 0.1-20 MG-MCG tablet Take 1 tablet  by mouth daily. 10/12/19   [provider]  sertraline (ZOLOFT) 100 MG tablet Take 100 mg by mouth daily. 11/14/19   [provider]    No Known Allergies  Family History  Problem Relation Age of Onset  . Healthy Mother   . Healthy Father     Social History Social History   Tobacco Use  . Smoking status: Never Smoker  . Smokeless tobacco: Former Clinical biochemist  . Vaping Use: Never used  Substance Use Topics  . Alcohol use: Yes    Comment: social  . Drug use: Yes    Types: Marijuana    Review of Systems Constitutional: Negative for fever. Cardiovascular: Negative for chest pain. Respiratory: Negative for shortness of breath. Gastrointestinal: Left flank pain.  Negative for vomiting.  States intermittent diarrhea and intermittent constipation. Genitourinary: Mild dysuria Musculoskeletal: Negative for musculoskeletal complaints Neurological: Negative for headache All other ROS negative  ____________________________________________   PHYSICAL EXAM:  VITAL SIGNS: ED Triage Vitals  Enc Vitals Group     BP 12/01/20 1546 118/79     Pulse Rate 12/01/20 1546 73     Resp 12/01/20 1546 18     Temp 12/01/20 1546 99 F (37.2 C)     Temp Source 12/01/20 1546 Oral     SpO2 12/01/20 1546 100 %     Weight 12/01/20 1547 135 lb (61.2 kg)  Height 12/01/20 1547 5\' 5"  (1.651 m)     Head Circumference --      Peak Flow --      Pain Score 12/01/20 1547 6     Pain Loc --      Pain Edu? --      Excl. in GC? --     Constitutional: Alert and oriented. Well appearing and in no distress. Eyes: Normal exam ENT      Head: Normocephalic and atraumatic.      Mouth/Throat: Mucous membranes are moist. Cardiovascular: Normal rate, regular rhythm. Respiratory: Normal respiratory effort without tachypnea nor retractions. Breath sounds are clear  Gastrointestinal:, Mild left lower quadrant abdominal tenderness palpation.  Mild left CVA tenderness.  No rebound guarding  or distention. Musculoskeletal: Nontender with normal range of motion in all extremities. Neurologic:  Normal speech and language. No gross focal neurologic deficits  Skin:  Skin is warm, dry and intact.  Psychiatric: Mood and affect are normal.   ____________________________________________   INITIAL IMPRESSION / ASSESSMENT AND PLAN / ED COURSE  Pertinent labs & imaging results that were available during my care of the patient were reviewed by me and considered in my medical decision making (see chart for details).   Patient presents to the emergency department for left flank pain ongoing over the past several weeks to months.  Was seen for the same 2 weeks ago with a reassuring work-up including negative CT scan.  Patient's lab work is reassuring today including a normal white blood cell count.  She does state mild dysuria and did recently complete a course of antibiotics for her urinary tract infection we will recheck a urine today.  Patient wishes to be checked for celiac's.  I discussed with the patient she would need to follow-up with GI medicine or her PCP for further testing.  She is agreeable to plan of care.  Urinalysis shows white blood cells with few bacteria once again.  We will send urine culture and dose a one-time dose of fosfomycin.  Patient states she has followed up with urology in the past I did a cath urine sample that was negative/normal.  I will refer to GI medicine for further work-up.  Patient agreeable.  Nicole Jackson was evaluated in Emergency Department on 12/01/2020 for the symptoms described in the history of present illness. She was evaluated in the context of the global COVID-19 pandemic, which necessitated consideration that the patient might be at risk for infection with the SARS-CoV-2 virus that causes COVID-19. Institutional protocols and algorithms that pertain to the evaluation of patients at risk for COVID-19 are in a state of rapid change based on information  released by regulatory bodies including the CDC and federal and state organizations. These policies and algorithms were followed during the patient's care in the ED.  ____________________________________________   FINAL CLINICAL IMPRESSION(S) / ED DIAGNOSES  Left flank pain   12/03/2020, MD 12/01/20 12/03/20

## 2020-12-01 NOTE — ED Notes (Signed)
Entered room to discharge pt. Pt yelling, yelling and cursing on phone with family member. Left room to discharge other pt. Now is shift change. Discharge papers are at bedside as well as ordered fosfomysin. Will give report to next nurse for discharge. Pt still on phone, seemingly very upset.

## 2020-12-03 LAB — URINE CULTURE

## 2020-12-06 ENCOUNTER — Encounter: Payer: Self-pay | Admitting: Gastroenterology

## 2020-12-06 ENCOUNTER — Other Ambulatory Visit: Payer: Self-pay

## 2020-12-06 ENCOUNTER — Ambulatory Visit (INDEPENDENT_AMBULATORY_CARE_PROVIDER_SITE_OTHER): Payer: BLUE CROSS/BLUE SHIELD | Admitting: Gastroenterology

## 2020-12-06 ENCOUNTER — Telehealth: Payer: Self-pay

## 2020-12-06 VITALS — BP 113/82 | HR 76 | Wt 150.4 lb

## 2020-12-06 DIAGNOSIS — K581 Irritable bowel syndrome with constipation: Secondary | ICD-10-CM

## 2020-12-06 DIAGNOSIS — R1032 Left lower quadrant pain: Secondary | ICD-10-CM

## 2020-12-06 DIAGNOSIS — K625 Hemorrhage of anus and rectum: Secondary | ICD-10-CM

## 2020-12-06 MED ORDER — NA SULFATE-K SULFATE-MG SULF 17.5-3.13-1.6 GM/177ML PO SOLN: 1.0000 | Freq: Once | ORAL | 0 refills | Status: AC

## 2020-12-06 NOTE — Patient Instructions (Signed)
Patient was given samples of fiber Choice supplements in office (4).

## 2020-12-06 NOTE — Progress Notes (Signed)
Wyline Mood MD, MRCP(U.K) [address]  [address]  [place], [place] [address]  Main: [phone]  Fax: [phone]   Gastroenterology Consultation  Referring Provider: Emergency room  primary Care Physician:  Patient, No Pcp Per (Inactive) Primary Gastroenterologist:  Dr. Wyline Mood  Reason for Consultation:     Abdominal pain        HPI:   Nicole Jackson is a 20 y.o. y/o female referred to see me after she was seen in the emergency room for abdominal pain on 2 occasions on 11/18/2020 and 12/01/2020.  She has been having left flank pain on and off.  She had a CT scan of the abdomen which was negative.  Test for STI was negative.  Hemoglobin on 12/01/2020 was 13.2 g with a CMP that was normal except a glucose of 101.  Moderate leukocytes in urine with some bacteria.  Negative pregnancy test.  Lipase is normal. She states that for the past 1 year on and off abdominal pain LLQ , pressure in nature, non radiating , most days a week , better after a bowel movement , associated with blood in the stool mixed, some weight loss, says she suffers from severe constipation. Does not have a bowel movement for days. Diet is poor in fiber.   Thinks she may be allergic to wheat , says was tested for celiac disease and was negative.  Past Medical History:  Diagnosis Date  . Anxiety   . Depression     Past Surgical History:  Procedure Laterality Date  . NO PAST SURGERIES      Prior to Admission medications   Medication Sig Start Date End Date Taking? Authorizing Provider  adapalene (DIFFERIN) 0.1 % cream Apply topically every other day. 10/15/19   [provider]  cyclobenzaprine (FLEXERIL) 5 MG tablet Take 1 tablet (5 mg total) by mouth 3 (three) times daily as needed for muscle spasms. 10/29/19   Chesley Noon, MD  dicyclomine (BENTYL) 10 MG capsule Take 1 capsule (10 mg total) by mouth 3 (three) times daily before meals for 10 days. 11/18/20 11/28/20  Minna Antis, MD  ibuprofen  (ADVIL) 800 MG tablet Take 1 tablet (800 mg total) by mouth every 8 (eight) hours as needed. 04/28/20   Mickie Bail, NP  LESSINA-28 0.1-20 MG-MCG tablet Take 1 tablet by mouth daily. 10/12/19   [provider]  sertraline (ZOLOFT) 100 MG tablet Take 100 mg by mouth daily. 11/14/19   [provider]    Family History  Problem Relation Age of Onset  . Healthy Mother   . Healthy Father      Social History   Tobacco Use  . Smoking status: Never Smoker  . Smokeless tobacco: Former Clinical biochemist  . Vaping Use: Never used  Substance Use Topics  . Alcohol use: Yes    Comment: social  . Drug use: Yes    Types: Marijuana    Allergies as of 12/06/2020  . (No Known Allergies)    Review of Systems:    All systems reviewed and negative except where noted in HPI.   Physical Exam:  LMP 11/28/2020  Patient's last menstrual period was 11/28/2020. Psych:  Alert and cooperative. Normal mood and affect. General:   Alert,  Well-developed, well-nourished, pleasant and cooperative in NAD Head:  Normocephalic and atraumatic. Eyes:  Sclera clear, no icterus.   Conjunctiva pink. Ears:  Normal auditory acuity. Lungs:  Respirations even and unlabored.  Clear throughout to  auscultation.   No wheezes, crackles, or rhonchi. No acute distress. Heart:  Regular rate and rhythm; no murmurs, clicks, rubs, or gallops. Abdomen:  Normal bowel sounds.  No bruits.  Soft, non-tender and non-distended without masses, hepatosplenomegaly or hernias noted.  No guarding or rebound tenderness.    Neurologic:  Alert and oriented x3;  grossly normal neurologically. Psych:  Alert and cooperative. Normal mood and affect.  Imaging Studies: CT Renal Stone Study  Result Date: 11/18/2020 CLINICAL DATA:  Right-sided abdominal pain and diarrhea. EXAM: CT ABDOMEN AND PELVIS WITHOUT CONTRAST TECHNIQUE: Multidetector CT imaging of the abdomen and pelvis was performed following the standard protocol without IV  contrast. COMPARISON:  None. FINDINGS: Lower chest: The lung bases are clear of acute process. No pleural effusion or pulmonary lesions. The heart is normal in size. No pericardial effusion. The distal esophagus and aorta are unremarkable. Hepatobiliary: No hepatic lesions are identified without contrast. No intrahepatic biliary dilatation. There is some layering higher attenuation material dependently in the gallbladder which is likely sludge. No findings to suggest acute cholecystitis. No common bile duct dilatation. Pancreas: No mass, inflammation or ductal dilatation. Spleen: Normal size.  No focal lesions. Adrenals/Urinary Tract: The adrenal glands and kidneys are unremarkable. No renal, ureteral or bladder calculi or mass. Stomach/Bowel: The stomach, duodenum, small bowel and colon are grossly normal. Vascular/Lymphatic: Insert aorta noncontrast Reproductive: The uterus and ovaries are unremarkable. Other: No pelvic mass or adenopathy. No free pelvic fluid collections. No inguinal mass or adenopathy. No abdominal wall hernia or subcutaneous lesions. Musculoskeletal: Significant scoliosis but no acute bony findings. IMPRESSION: 1. No acute abdominal/pelvic findings, mass lesions or adenopathy. 2. No renal, ureteral or bladder calculi or mass. 3. Scoliosis. Electronically Signed   By: Rudie Meyer M.D.   On: 11/18/2020 16:10    Assessment and Plan:   Nicole Jackson is a 20 y.o. y/o female has been referred for left-sided abdominal pain.  2 ER visits in April for the same reason.Likely IBS-C  Plan 1.  Check Food allergy profile, TSH for constipation  2. Colonoscopy to evaluate rectal bleeding  3. Likely has IBS-C, commence on high fiber diet 25 grams a day , patient information provided. Suggest daily miralax. IF fails commence on Linzess.  4. If no better at next visit will add bentyl. She is planning to move to [facility] and will need to obtain care subsequently locally    I have discussed  alternative options, risks & benefits,  which include, but are not limited to, bleeding, infection, perforation,respiratory complication & drug reaction.  The patient agrees with this plan & written consent will be obtained.     Follow up in 4 weeks video visit   Dr Wyline Mood MD,MRCP(U.K)

## 2020-12-07 ENCOUNTER — Telehealth: Payer: Self-pay | Admitting: Gastroenterology

## 2020-12-07 ENCOUNTER — Telehealth: Payer: Self-pay

## 2020-12-07 ENCOUNTER — Other Ambulatory Visit: Payer: Self-pay

## 2020-12-07 DIAGNOSIS — K581 Irritable bowel syndrome with constipation: Secondary | ICD-10-CM

## 2020-12-07 NOTE — Telephone Encounter (Signed)
Returned patients call in regards to rescheduling her colonoscopy with Dr. Tobi Bastos.  Pt states her father plans on flying in to be with her since this is her first time having anesthesia.  Her procedure has been rescheduled to 12/19/20.  Thanks  [place], [place]

## 2020-12-07 NOTE — Telephone Encounter (Signed)
ERROR

## 2020-12-07 NOTE — Telephone Encounter (Signed)
Patient called LVM asking for letter to [facility] to support her need to do her classes virtually.  Please call back

## 2020-12-07 NOTE — Progress Notes (Signed)
TSH is low can we add on Free t4 and t3 to the lab please, if not possible will need a repeat lab draw

## 2020-12-07 NOTE — Telephone Encounter (Signed)
Patient LVM requesting to reschedule colonoscopy from 05/09 to 05/10.  Endoscopy has been notified of date change to 12/20/20.  Thanks,  [place], [place]

## 2020-12-07 NOTE — Progress Notes (Signed)
Provider has requested to add-on additional test. Lab orders placed.

## 2020-12-07 NOTE — Telephone Encounter (Deleted)
error 

## 2020-12-12 ENCOUNTER — Telehealth: Payer: Self-pay | Admitting: Gastroenterology

## 2020-12-12 NOTE — Telephone Encounter (Signed)
Patient called second time to ask Dr Tobi Bastos to write letter for her for school .  Patient stated she is still in a lot of pain. Please call to advise  Note Patient called LVM asking for letter to [facility] to support her need to do her classes virtually.  Please call back

## 2020-12-12 NOTE — Telephone Encounter (Signed)
Returned patients call. Informed patient Dr. Tobi Bastos will not write a letter for her to due virtual class. I did tell patient I can send her a note for the appointment she had with [place]. Informed patient the day of her procedure she can request a note from the hospital to excuse her. Pt verbalized understanding.

## 2020-12-13 LAB — FOOD ALLERGY PROFILE
Allergen Corn, IgE: <0.1 kU/L
Clam IgE: <0.1 kU/L
Codfish IgE: <0.1 kU/L
Egg White IgE: <0.1 kU/L
Milk IgE: <0.1 kU/L
Peanut IgE: <0.1 kU/L
Scallop IgE: <0.1 kU/L
Sesame Seed IgE: <0.1 kU/L
Shrimp IgE: <0.1 kU/L
Soybean IgE: <0.1 kU/L
Walnut IgE: <0.1 kU/L
Wheat IgE: <0.1 kU/L

## 2020-12-13 LAB — TSH: TSH: 0.343 u[IU]/mL — ABNORMAL LOW (ref 0.450–4.500)

## 2020-12-18 LAB — TSH+T3+FREE T4+T3 FREE
Free T-3: 3.2 pg/mL
Free T4 by Dialysis: 0.97 ng/dL
TSH: 0.32 uU/mL
Triiodothyronine (T-3), Serum: 107 ng/dL

## 2020-12-18 LAB — SPECIMEN STATUS REPORT

## 2020-12-20 ENCOUNTER — Other Ambulatory Visit: Payer: Self-pay

## 2020-12-20 ENCOUNTER — Encounter
Admission: RE | Disposition: A | Discharge status: Home / Self Care | Payer: Self-pay | Source: Home / Self Care | Attending: Gastroenterology

## 2020-12-20 ENCOUNTER — Ambulatory Visit: Payer: BLUE CROSS/BLUE SHIELD | Admitting: Certified Registered"

## 2020-12-20 ENCOUNTER — Encounter: Payer: Self-pay | Admitting: Gastroenterology

## 2020-12-20 ENCOUNTER — Ambulatory Visit
Admission: RE | Admit: 2020-12-20 | Discharge: 2020-12-20 | Disposition: A | Discharge status: Home / Self Care | Payer: BLUE CROSS/BLUE SHIELD | Attending: Gastroenterology | Admitting: Gastroenterology

## 2020-12-20 DIAGNOSIS — Z793 Long term (current) use of hormonal contraceptives: Secondary | ICD-10-CM | POA: Insufficient documentation

## 2020-12-20 DIAGNOSIS — Z79899 Other long term (current) drug therapy: Secondary | ICD-10-CM | POA: Diagnosis not present

## 2020-12-20 DIAGNOSIS — K625 Hemorrhage of anus and rectum: Secondary | ICD-10-CM | POA: Diagnosis present

## 2020-12-20 DIAGNOSIS — K581 Irritable bowel syndrome with constipation: Secondary | ICD-10-CM

## 2020-12-20 HISTORY — PX: COLONOSCOPY WITH PROPOFOL: SHX5780

## 2020-12-20 LAB — POCT PREGNANCY, URINE: Preg Test, Ur: NEGATIVE

## 2020-12-20 SURGERY — COLONOSCOPY WITH PROPOFOL
Anesthesia: General

## 2020-12-20 MED ORDER — MIDAZOLAM HCL 2 MG/2ML IJ SOLN
INTRAMUSCULAR | Status: DC | PRN
  Administered 2020-12-20: 2 mg via INTRAVENOUS

## 2020-12-20 MED ORDER — LIDOCAINE HCL (CARDIAC) PF 100 MG/5ML IV SOSY
PREFILLED_SYRINGE | INTRAVENOUS | Status: DC | PRN
  Administered 2020-12-20: 20 mg via INTRAVENOUS

## 2020-12-20 MED ORDER — PROPOFOL 500 MG/50ML IV EMUL
INTRAVENOUS | Status: DC | PRN
  Administered 2020-12-20: 175 ug/kg/min via INTRAVENOUS

## 2020-12-20 MED ORDER — PROPOFOL 10 MG/ML IV BOLUS
INTRAVENOUS | Status: DC | PRN
  Administered 2020-12-20: 70 mg via INTRAVENOUS
  Administered 2020-12-20: 30 mg via INTRAVENOUS

## 2020-12-20 MED ORDER — SODIUM CHLORIDE 0.9 % IV SOLN: INTRAVENOUS | Status: DC

## 2020-12-20 NOTE — H&P (Signed)
Wyline Mood, MD [address], [place], [place], [address] [address], [place], [place], [address] Phone: [phone]  Fax: [phone]  Primary Care Physician:  Patient, No Pcp Per (Inactive)   Pre-Procedure History & Physical: HPI:  Bisma Klett is a 20 y.o. female is here for an colonoscopy.   Past Medical History:  Diagnosis Date  . Anxiety   . Depression     Past Surgical History:  Procedure Laterality Date  . NO PAST SURGERIES      Prior to Admission medications   Medication Sig Start Date End Date Taking? Authorizing Provider  ALPRAZolam (XANAX) 0.25 MG tablet Take 0.25 mg by mouth 3 (three) times daily as needed for anxiety.   Yes [provider]  ibuprofen (ADVIL) 800 MG tablet Take 1 tablet (800 mg total) by mouth every 8 (eight) hours as needed. 04/28/20  Yes Mickie Bail, NP  LESSINA-28 0.1-20 MG-MCG tablet Take 1 tablet by mouth daily. 10/12/19  Yes [provider]  sertraline (ZOLOFT) 100 MG tablet Take 100 mg by mouth daily. 11/14/19  Yes [provider]  adapalene (DIFFERIN) 0.1 % cream Apply topically every other day. Patient not taking: Reported on 12/06/2020 10/15/19   [provider]  cyclobenzaprine (FLEXERIL) 5 MG tablet Take 1 tablet (5 mg total) by mouth 3 (three) times daily as needed for muscle spasms. Patient not taking: Reported on 12/06/2020 10/29/19   Chesley Noon, MD  dicyclomine (BENTYL) 10 MG capsule Take 1 capsule (10 mg total) by mouth 3 (three) times daily before meals for 10 days. 11/18/20 11/28/20  Minna Antis, MD    Allergies as of 12/06/2020  . (No Known Allergies)    Family History  Problem Relation Age of Onset  . Healthy Mother   . Healthy Father     Social History   Socioeconomic History  . Marital status: Single    Spouse name: Not on file  . Number of children: Not on file  . Years of education: Not on file  . Highest education level: Not on file   Occupational History  . Not on file  Tobacco Use  . Smoking status: Never Smoker  . Smokeless tobacco: Former Clinical biochemist  . Vaping Use: Never used  Substance and Sexual Activity  . Alcohol use: Yes    Comment: social  . Drug use: Yes    Types: Marijuana  . Sexual activity: Not Currently    Birth control/protection: Pill  Other Topics Concern  . Not on file  Social History Narrative  . Not on file   Social Determinants of Health   Financial Resource Strain: Not on file  Food Insecurity: Not on file  Transportation Needs: Not on file  Physical Activity: Not on file  Stress: Not on file  Social Connections: Not on file  Intimate Partner Violence: Not on file    Review of Systems: See HPI, otherwise negative ROS  Physical Exam: BP (!) 113/53   Pulse 71   Temp (!) 97.2 F (36.2 C) (Temporal)   Resp 16   Ht 5\' 5"  (1.651 m)   Wt 68.2 kg   LMP 11/28/2020   SpO2 100%   BMI 25.02 kg/m  General:   Alert,  pleasant and cooperative in NAD Head:  Normocephalic and atraumatic. Neck:  Supple; no masses or thyromegaly. Lungs:  Clear throughout to auscultation, normal respiratory effort.    Heart:  +S1, +S2, Regular rate  and rhythm, No edema. Abdomen:  Soft, nontender and nondistended. Normal bowel sounds, without guarding, and without rebound.   Neurologic:  Alert and  oriented x4;  grossly normal neurologically.  Impression/Plan: Haeley Fordham is here for an colonoscopy to be performed for rectal bleeding .   Risks, benefits, limitations, and alternatives regarding  colonoscopy have been reviewed with the patient.  Questions have been answered.  All parties agreeable.   Wyline Mood, MD  12/20/2020, 10:19 AM

## 2020-12-20 NOTE — Transfer of Care (Signed)
Immediate Anesthesia Transfer of Care Note  Patient: Nicole Jackson  Procedure(s) Performed: COLONOSCOPY WITH PROPOFOL (N/A )  Patient Location: Endoscopy Unit  Anesthesia Type:General  Level of Consciousness: drowsy and patient cooperative  Airway & Oxygen Therapy: Patient Spontanous Breathing and Patient connected to face mask oxygen  Post-op Assessment: Report given to RN and Post -op Vital signs reviewed and stable  Post vital signs: Reviewed and stable  Last Vitals:  Vitals Value Taken Time  BP 102/69 12/20/20 1050  Temp 36.4 C 12/20/20 1046  Pulse 43 12/20/20 1055  Resp 23 12/20/20 1055  SpO2 100 % 12/20/20 1055  Vitals shown include unvalidated device data.  Last Pain:  Vitals:   12/20/20 1046  TempSrc: Temporal  PainSc: Asleep         Complications: No complications documented.

## 2020-12-20 NOTE — Anesthesia Postprocedure Evaluation (Signed)
Anesthesia Post Note  Patient: Nicole Jackson  Procedure(s) Performed: COLONOSCOPY WITH PROPOFOL (N/A )  Patient location during evaluation: Endoscopy Anesthesia Type: General Level of consciousness: awake and alert and oriented Pain management: pain level controlled Vital Signs Assessment: post-procedure vital signs reviewed and stable Respiratory status: spontaneous breathing Cardiovascular status: blood pressure returned to baseline Anesthetic complications: no   No complications documented.   Last Vitals:  Vitals:   12/20/20 1046 12/20/20 1120  BP: 102/69 104/74  Pulse: 63   Resp: (!) 23   Temp: (!) 36.4 C   SpO2: 99%     Last Pain:  Vitals:   12/20/20 1120  TempSrc:   PainSc: 0-No pain                 Meighan Treto

## 2020-12-20 NOTE — Anesthesia Preprocedure Evaluation (Signed)
Anesthesia Evaluation  Patient identified by MRN, date of birth, ID band Patient awake    Reviewed: Allergy & Precautions, NPO status , Patient's Chart, lab work & pertinent test results  Airway Mallampati: II  TM Distance: >3 FB     Dental  (+) Teeth Intact   Pulmonary neg pulmonary ROS,    Pulmonary exam normal        Cardiovascular negative cardio ROS Normal cardiovascular exam     Neuro/Psych PSYCHIATRIC DISORDERS Anxiety Depression negative neurological ROS     GI/Hepatic negative GI ROS, Neg liver ROS,   Endo/Other  negative endocrine ROS  Renal/GU negative Renal ROS  negative genitourinary   Musculoskeletal negative musculoskeletal ROS (+)   Abdominal Normal abdominal exam  (+)   Peds negative pediatric ROS (+)  Hematology negative hematology ROS (+)   Anesthesia Other Findings   Reproductive/Obstetrics                             Anesthesia Physical Anesthesia Plan  ASA: I  Anesthesia Plan: General   Post-op Pain Management:    Induction: Intravenous  PONV Risk Score and Plan: Propofol infusion and TIVA  Airway Management Planned:   Additional Equipment:   Intra-op Plan:   Post-operative Plan:   Informed Consent: I have reviewed the patients History and Physical, chart, labs and discussed the procedure including the risks, benefits and alternatives for the proposed anesthesia with the patient or authorized representative who has indicated his/her understanding and acceptance.     Dental advisory given  Plan Discussed with: CRNA and Surgeon  Anesthesia Plan Comments:         Anesthesia Quick Evaluation

## 2020-12-20 NOTE — Anesthesia Procedure Notes (Signed)
Procedure Name: General with mask airway Performed by: Fletcher-Harrison, Akiel Fennell, CRNA Pre-anesthesia Checklist: Patient identified, Emergency Drugs available, Suction available and Patient being monitored Patient Re-evaluated:Patient Re-evaluated prior to induction Oxygen Delivery Method: Simple face mask Induction Type: IV induction Placement Confirmation: positive ETCO2 and CO2 detector Dental Injury: Teeth and Oropharynx as per pre-operative assessment        

## 2020-12-20 NOTE — Op Note (Signed)
[facility] Gastroenterology Patient Name: Nicole Jackson Procedure Date: 12/20/2020 10:16 AM MRN: [id number] Account #: [id number] Date of Birth: 08-21-2000 Admit Type: Outpatient Age: 20 Room: [facility] ENDO ROOM 2 Gender: Female Note Status: Finalized Procedure:             Colonoscopy Indications:           Rectal bleeding Providers:             Wyline Mood MD, MD Referring MD:          No Local Md, MD (Referring MD) Medicines:             Monitored Anesthesia Care Complications:         No immediate complications. Procedure:             Pre-Anesthesia Assessment:                        - Prior to the procedure, a History and Physical was                         performed, and patient medications, allergies and                         sensitivities were reviewed. The patient's tolerance                         of previous anesthesia was reviewed.                        - The risks and benefits of the procedure and the                         sedation options and risks were discussed with the                         patient. All questions were answered and informed                         consent was obtained.                        - ASA Grade Assessment: II - A patient with mild                         systemic disease.                        After obtaining informed consent, the colonoscope was                         passed under direct vision. Throughout the procedure,                         the patient's blood pressure, pulse, and oxygen                         saturations were monitored continuously. The                         Colonoscope was introduced through the anus and  advanced to the the cecum, identified by the                         appendiceal orifice. The colonoscopy was performed                         with ease. The patient tolerated the procedure well.                         The quality of the bowel preparation was  good. Findings:      The perianal and digital rectal examinations were normal.      The entire examined colon appeared normal on direct and retroflexion       views. Impression:            - The entire examined colon is normal on direct and                         retroflexion views.                        - No specimens collected. Recommendation:        - Discharge patient to home (with escort).                        - Resume previous diet.                        - Continue present medications.                        - Return to my office as previously scheduled. Procedure Code(s):     --- Professional ---                        [phone], Colonoscopy, flexible; diagnostic, including                         collection of specimen(s) by brushing or washing, when                         performed (separate procedure) Diagnosis Code(s):     --- Professional ---                        K62.5, Hemorrhage of anus and rectum CPT copyright 2019 [employer]. All rights reserved. The codes documented in this report are preliminary and upon coder review may  be revised to meet current compliance requirements. Wyline Mood, MD Wyline Mood MD, MD 12/20/2020 10:45:10 AM This report has been signed electronically. Number of Addenda: 0 Note Initiated On: 12/20/2020 10:16 AM Scope Withdrawal Time: 0 hours 5 minutes 45 seconds  Total Procedure Duration: 0 hours 8 minutes 26 seconds  Estimated Blood Loss:  Estimated blood loss: none.      [facility]

## 2020-12-21 ENCOUNTER — Encounter: Payer: Self-pay | Admitting: Gastroenterology

## 2021-01-05 ENCOUNTER — Telehealth (INDEPENDENT_AMBULATORY_CARE_PROVIDER_SITE_OTHER): Payer: BLUE CROSS/BLUE SHIELD | Admitting: Gastroenterology

## 2021-01-05 ENCOUNTER — Telehealth: Payer: Self-pay

## 2021-01-05 DIAGNOSIS — R946 Abnormal results of thyroid function studies: Secondary | ICD-10-CM | POA: Diagnosis not present

## 2021-01-05 DIAGNOSIS — K625 Hemorrhage of anus and rectum: Secondary | ICD-10-CM

## 2021-01-05 DIAGNOSIS — K581 Irritable bowel syndrome with constipation: Secondary | ICD-10-CM

## 2021-01-05 NOTE — Progress Notes (Signed)
Wyline Mood , MD [address]  [address]  [place], [place] [address]  Main: [phone]  Fax: [phone]   Primary Care Physician: Patient, No Pcp Per (Inactive)  Virtual Visit via Video Note  I connected with patient on 01/05/21 at  1:30 PM EDT by video and verified that I am speaking with the correct person using two identifiers.   I discussed the limitations, risks, security and privacy concerns of performing an evaluation and management service by video  and the availability of in person appointments. I also discussed with the patient that there may be a patient responsible charge related to this service. The patient expressed understanding and agreed to proceed.  Location of Patient: Home Location of Provider: Home Persons involved: Patient and provider only   History of Present Illness:   C/c : Follow-up for IBS-C and rectal bleeding  HPI: Nicole Jackson is a 20 y.o. female  Summary of history :  Initially seen and referred in April 2022 for abdominal pain.  Previously she was seen in the emergency room for abdominal pain on 2 occasions on 11/18/2020 and 12/01/2020.  She has been having left flank pain on and off.  She had a CT scan of the abdomen which was negative.  Test for STI was negative.  Hemoglobin on 12/01/2020 was 13.2 g with a CMP that was normal except a glucose of 101.  Moderate leukocytes in urine with some bacteria.  Negative pregnancy test.  Lipase is normal.   For the past 1 year on and off abdominal pain LLQ , pressure in nature, non radiating , most days a week , better after a bowel movement , associated with blood in the stool mixed, some weight loss, says she suffers from severe constipation. Does not have a bowel movement for days. Diet is poor in fiber. Tested for celiac disease and was negative.    Interval history 12/06/2020-01/05/2021  12/06/2020 Food allergy profile was negative, TSH low at 0.343, free T4 and T3 were normal 12/20/2020:  Colonoscopy: Possible small internal hemorrhoids noted.   Since her colonoscopy she had increase the fiber in her diet and she is doing well.  No complaints.     Current Outpatient Medications  Medication Sig Dispense Refill  . adapalene (DIFFERIN) 0.1 % cream Apply topically every other day. (Patient not taking: Reported on 12/06/2020)    . ALPRAZolam (XANAX) 0.25 MG tablet Take 0.25 mg by mouth 3 (three) times daily as needed for anxiety.    . cyclobenzaprine (FLEXERIL) 5 MG tablet Take 1 tablet (5 mg total) by mouth 3 (three) times daily as needed for muscle spasms. (Patient not taking: Reported on 12/06/2020) 15 tablet 0  . dicyclomine (BENTYL) 10 MG capsule Take 1 capsule (10 mg total) by mouth 3 (three) times daily before meals for 10 days. 30 capsule 0  . ibuprofen (ADVIL) 800 MG tablet Take 1 tablet (800 mg total) by mouth every 8 (eight) hours as needed. 21 tablet 0  . LESSINA-28 0.1-20 MG-MCG tablet Take 1 tablet by mouth daily.    . sertraline (ZOLOFT) 100 MG tablet Take 100 mg by mouth daily.     No current facility-administered medications for this visit.    Allergies as of 01/05/2021  . (No Known Allergies)    Review of Systems:    All systems reviewed and negative except where noted in HPI.  General Appearance:    Alert, cooperative, no distress, appears stated age  Head:  Normocephalic, without obvious abnormality, atraumatic  Eyes:    PERRL, conjunctiva/corneas clear,  Ears:    Grossly normal hearing    Neurologic:  Grossly normal    Observations/Objective:  Labs: CMP     Component Value Date/Time   NA 138 12/01/2020 1549   K 4.8 12/01/2020 1549   CL 102 12/01/2020 1549   CO2 28 12/01/2020 1549   GLUCOSE 101 (H) 12/01/2020 1549   BUN 12 12/01/2020 1549   CREATININE 0.87 12/01/2020 1549   CALCIUM 9.7 12/01/2020 1549   PROT 7.8 12/01/2020 1549   ALBUMIN 4.4 12/01/2020 1549   AST 28 12/01/2020 1549   ALT 18 12/01/2020 1549   ALKPHOS 49 12/01/2020 1549    BILITOT 0.9 12/01/2020 1549   GFRNONAA >60 12/01/2020 1549   Lab Results  Component Value Date   WBC 8.0 12/01/2020   HGB 13.2 12/01/2020   HCT 40.9 12/01/2020   MCV 84.2 12/01/2020   PLT 241 12/01/2020    Imaging Studies: No results found.  Assessment and Plan:   Nicole Jackson is a 20 y.o. y/o female here to follow up for  IBS-C, rectal bleeding .   Plan 1.   Low TSH with normal T4 and T3 could be subclinical hyperthyroidism.  She would need to have the TSH repeated in a few weeks time.  Continue high-fiber diet which has resolved her rectal bleeding.  I have informed that she must have her thyroid function test checked by her primary care physician in a few weeks.   I discussed the assessment and treatment plan with the patient. The patient was provided an opportunity to ask questions and all were answered. The patient agreed with the plan and demonstrated an understanding of the instructions.   The patient was advised to call back or seek an in-person evaluation if the symptoms worsen or if the condition fails to improve as anticipated.  I provided 18 minutes of face-to-face time during this encounter.  Dr Wyline Mood MD,MRCP [facility]) Gastroenterology/Hepatology Pager: [phone]   Speech recognition software was used to dictate this note.

## 2021-01-05 NOTE — Telephone Encounter (Signed)
Called patient to reminder her of the appointment today. No answer. Appointment will be cancelled.

## 2021-01-05 NOTE — Patient Instructions (Signed)
Follow up as needed

## 2022-05-31 IMAGING — CT CT RENAL STONE PROTOCOL
3 of 4 series · 9 of 46 positions shown, 16 images · non-contrast
Comparison: None.

CLINICAL DATA: Right-sided abdominal pain and diarrhea.

EXAM:
CT ABDOMEN AND PELVIS WITHOUT CONTRAST
TECHNIQUE: Multidetector CT imaging of the abdomen and pelvis was performed
following the standard protocol without IV contrast.

[Series 4: lung bases · axial · 0.62mm/px · z∈[-113,-38]mm · 5 of 23 slices shown, 10 images]
[im 4/23  soft-tissue]
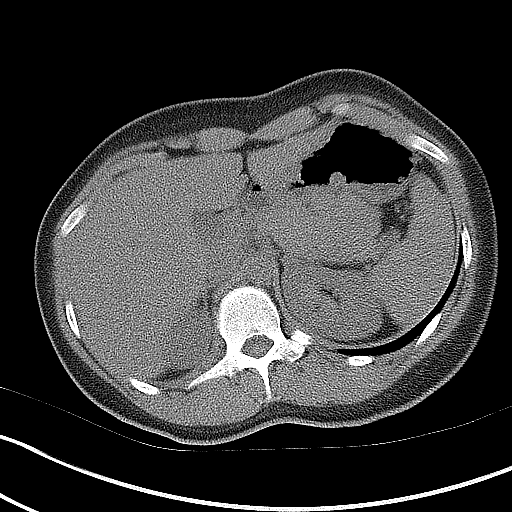
[im 4/23  bone]
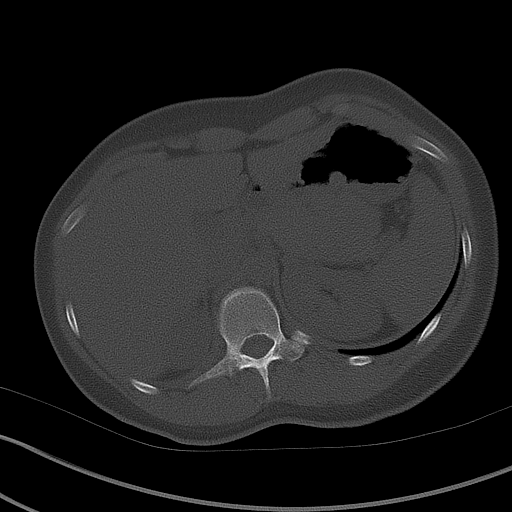
[im 8/23  soft-tissue]
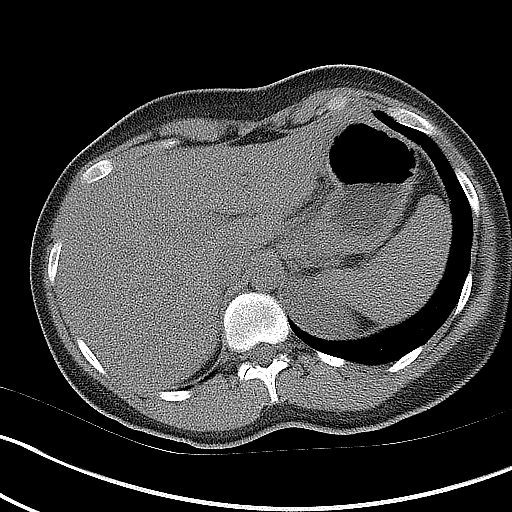
[im 8/23  lung]
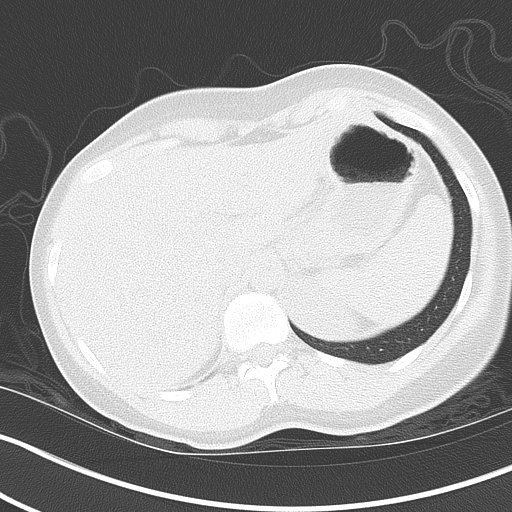
[im 12/23  soft-tissue]
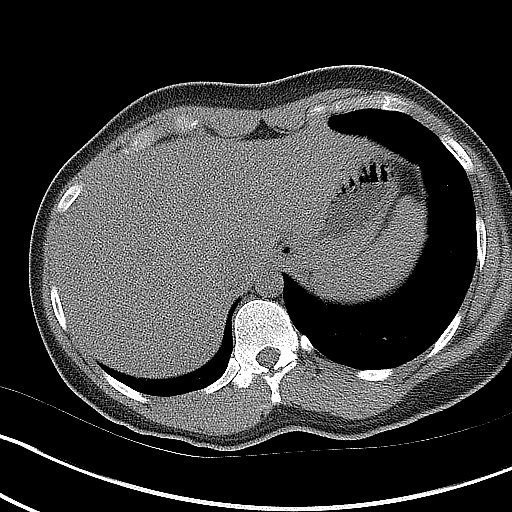
[im 12/23  lung]
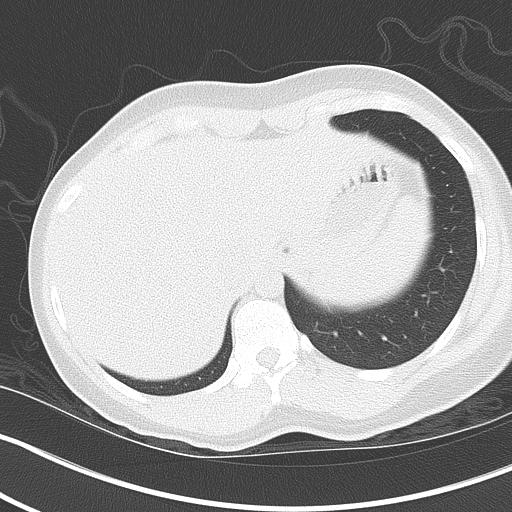
[im 15/23  soft-tissue]
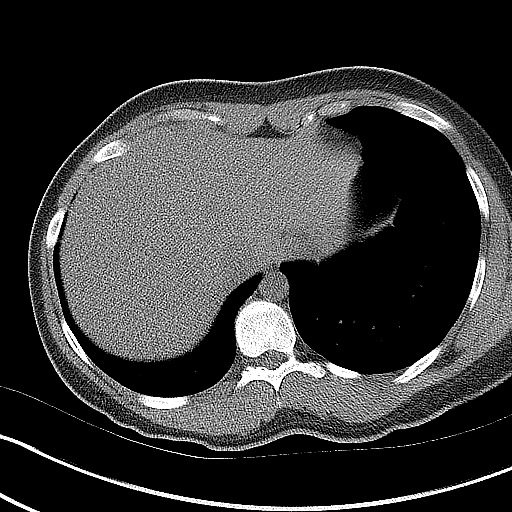
[im 15/23  lung]
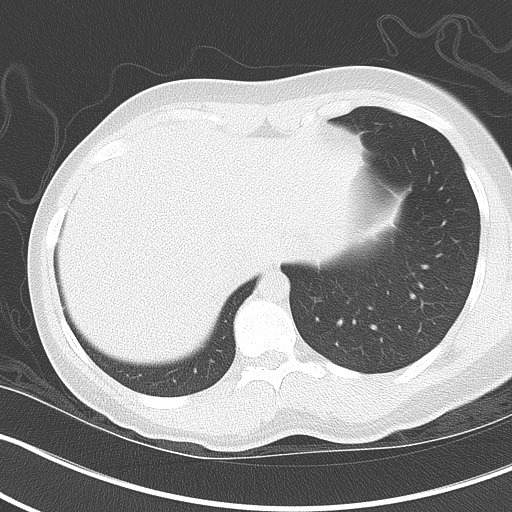
[im 19/23  soft-tissue]
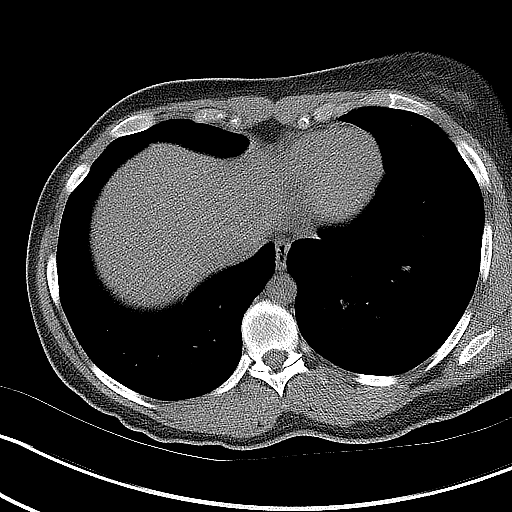
[im 19/23  lung]
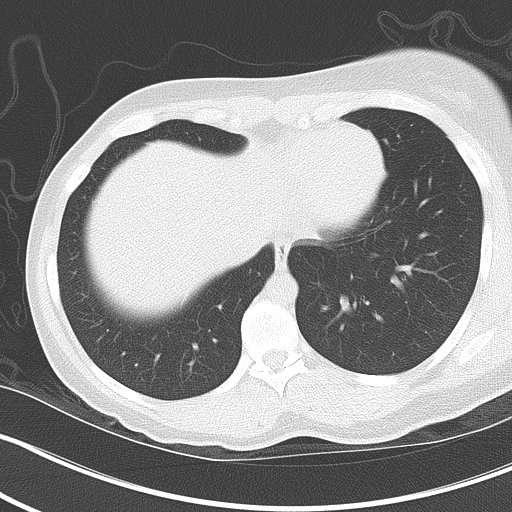

[Series 5: coronal · coronal · 0.69mm/px · 3 of 119 slices shown, 4 images]
[im 40/119  soft-tissue]
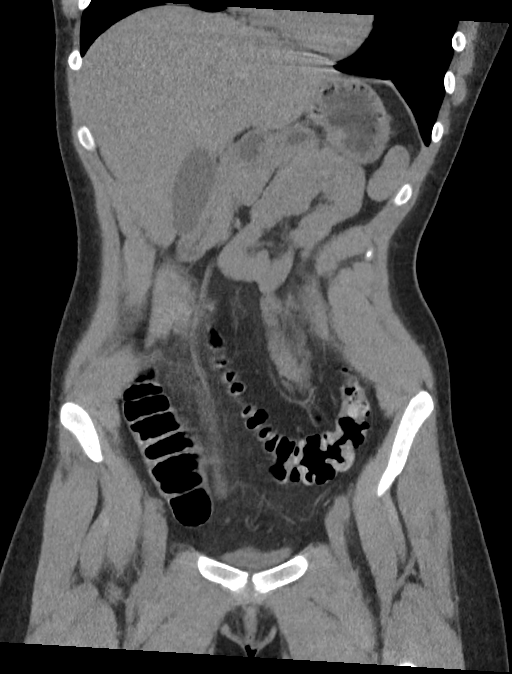
[im 53/119  soft-tissue]
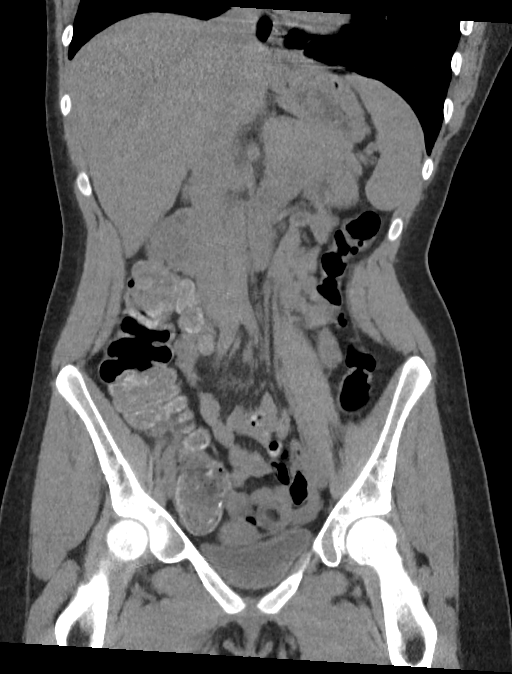
[im 53/119  bone]
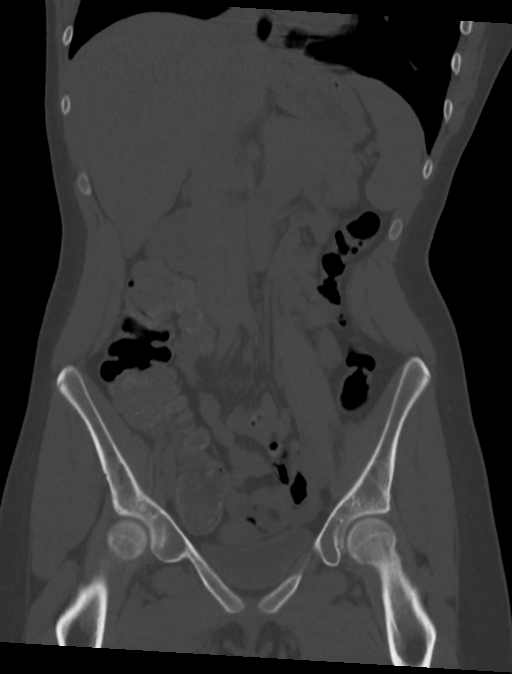
[im 66/119  soft-tissue]
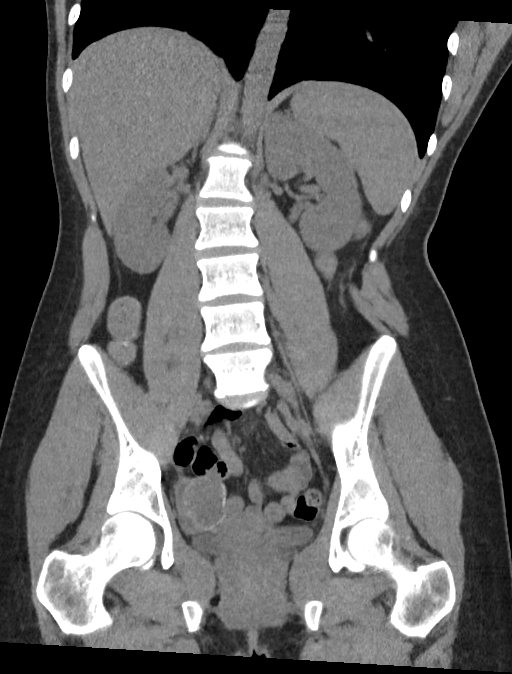

[Series 6: sagittal · sagittal · 0.47mm/px · 1 of 171 slices shown, 2 images]
[im 57/171  soft-tissue]
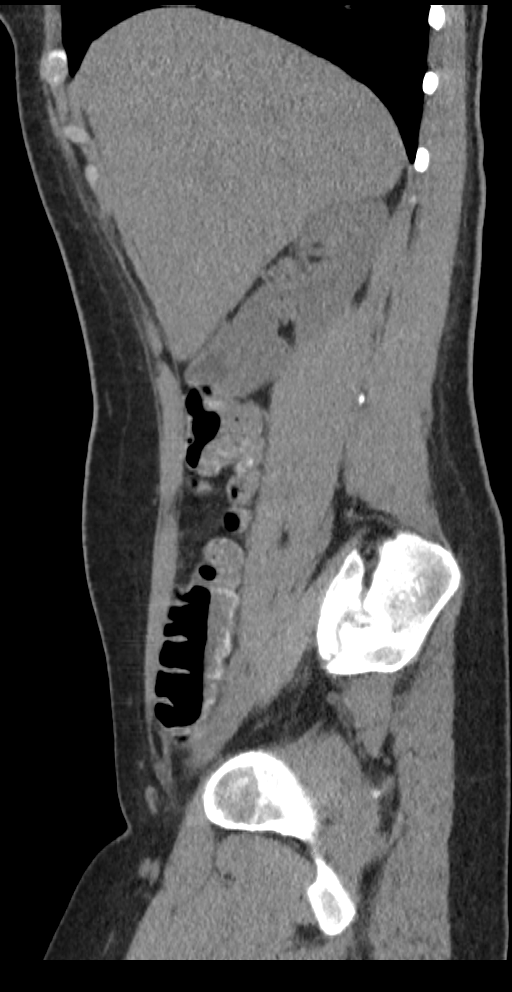
[im 57/171  bone]
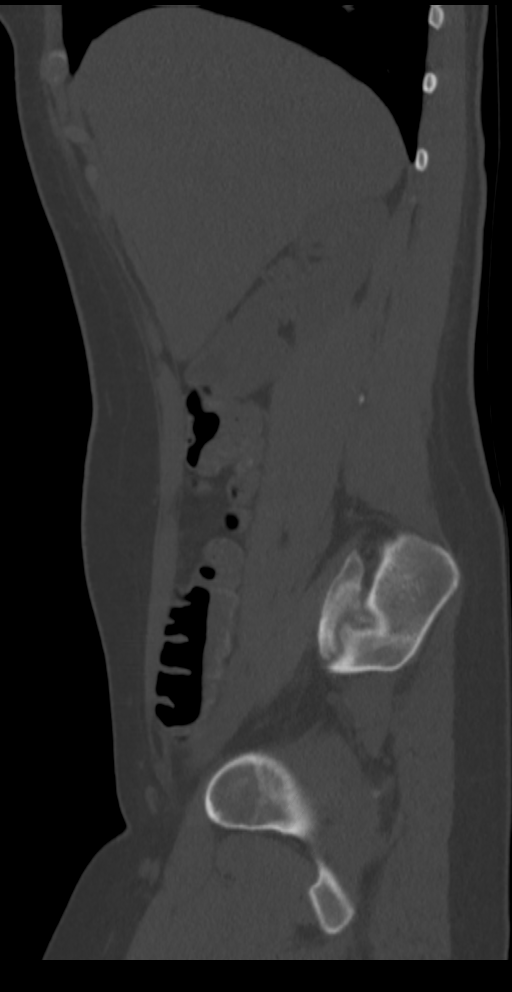

[9 of 46 positions shown; findings below may reference images not displayed]

FINDINGS: Lower chest: The lung bases are clear of acute process. No pleural
effusion or pulmonary lesions. The heart is normal in size. No
pericardial effusion. The distal esophagus and aorta are
unremarkable.

Hepatobiliary: No hepatic lesions are identified without contrast.
No intrahepatic biliary dilatation. There is some layering higher
attenuation material dependently in the gallbladder which is likely
sludge. No findings to suggest acute cholecystitis. No common bile
duct dilatation.

Pancreas: No mass, inflammation or ductal dilatation.

Spleen: Normal size.  No focal lesions.

Adrenals/Urinary Tract: The adrenal glands and kidneys are
unremarkable. No renal, ureteral or bladder calculi or mass.

Stomach/Bowel: The stomach, duodenum, small bowel and colon are
grossly normal.

Vascular/Lymphatic: Insert aorta noncontrast

Reproductive: The uterus and ovaries are unremarkable.

Other: No pelvic mass or adenopathy. No free pelvic fluid
collections. No inguinal mass or adenopathy. No abdominal wall
hernia or subcutaneous lesions.

Musculoskeletal: Significant scoliosis but no acute bony findings.
IMPRESSION: 1. No acute abdominal/pelvic findings, mass lesions or adenopathy.
2. No renal, ureteral or bladder calculi or mass.
3. Scoliosis.
